# Patient Record
Sex: Male | Born: 1970 | Race: White | Hispanic: No | Marital: Single | Smoking: Current every day smoker
Health system: Southern US, Community
[De-identification: ages and names within clinical notes are randomized; demographics above are authoritative.]

## PROBLEM LIST (undated history)

## (undated) DIAGNOSIS — M1990 Unspecified osteoarthritis, unspecified site: Secondary | ICD-10-CM

## (undated) DIAGNOSIS — B20 Human immunodeficiency virus [HIV] disease: Secondary | ICD-10-CM

## (undated) HISTORY — DX: Human immunodeficiency virus (HIV) disease: B20

## (undated) HISTORY — DX: Unspecified osteoarthritis, unspecified site: M19.90

---

## 2013-03-25 ENCOUNTER — Emergency Department: Payer: Self-pay | Admitting: Emergency Medicine

## 2013-03-25 LAB — CBC
HGB: 14.1 g/dL (ref 13.0–18.0)
Platelet: 82 10*3/uL — ABNORMAL LOW (ref 150–440)
RBC: 3.91 10*6/uL — ABNORMAL LOW (ref 4.40–5.90)
WBC: 7.3 10*3/uL (ref 3.8–10.6)

## 2013-03-25 LAB — COMPREHENSIVE METABOLIC PANEL
Albumin: 4.3 g/dL (ref 3.4–5.0)
BUN: 11 mg/dL (ref 7–18)
Bilirubin,Total: 1.4 mg/dL — ABNORMAL HIGH (ref 0.2–1.0)
Calcium, Total: 9.2 mg/dL (ref 8.5–10.1)
Chloride: 100 mmol/L (ref 98–107)
Co2: 23 mmol/L (ref 21–32)
EGFR (African American): >60
EGFR (Non-African Amer.): >60
Glucose: 180 mg/dL — ABNORMAL HIGH (ref 65–99)
Osmolality: 276 (ref 275–301)

## 2013-03-25 LAB — DRUG SCREEN, URINE
Amphetamines, Ur Screen: NEGATIVE (ref ?–1000)
Benzodiazepine, Ur Scrn: NEGATIVE (ref ?–200)
Cocaine Metabolite,Ur ~~LOC~~: NEGATIVE (ref ?–300)
MDMA (Ecstasy)Ur Screen: NEGATIVE (ref ?–500)
Methadone, Ur Screen: NEGATIVE (ref ?–300)
Opiate, Ur Screen: NEGATIVE (ref ?–300)
Phencyclidine (PCP) Ur S: NEGATIVE (ref ?–25)
Tricyclic, Ur Screen: NEGATIVE (ref ?–1000)

## 2013-03-25 LAB — ETHANOL
Ethanol %: 0.184 % — ABNORMAL HIGH (ref 0.000–0.080)
Ethanol: 184 mg/dL

## 2013-03-25 LAB — ACETAMINOPHEN LEVEL: Acetaminophen: <2 ug/mL

## 2013-03-25 LAB — TSH: Thyroid Stimulating Horm: 2.46 u[IU]/mL

## 2013-03-25 LAB — SALICYLATE LEVEL: Salicylates, Serum: 3.8 mg/dL — ABNORMAL HIGH

## 2013-03-26 LAB — ETHANOL: Ethanol: 13 mg/dL

## 2013-09-26 ENCOUNTER — Other Ambulatory Visit: Payer: Self-pay

## 2013-09-26 ENCOUNTER — Other Ambulatory Visit: Payer: Self-pay | Admitting: *Deleted

## 2013-09-26 DIAGNOSIS — B20 Human immunodeficiency virus [HIV] disease: Secondary | ICD-10-CM

## 2013-09-27 ENCOUNTER — Encounter: Payer: Self-pay | Admitting: *Deleted

## 2013-09-27 ENCOUNTER — Other Ambulatory Visit (INDEPENDENT_AMBULATORY_CARE_PROVIDER_SITE_OTHER): Payer: Self-pay

## 2013-09-27 ENCOUNTER — Other Ambulatory Visit: Payer: Self-pay

## 2013-09-27 DIAGNOSIS — Z113 Encounter for screening for infections with a predominantly sexual mode of transmission: Secondary | ICD-10-CM

## 2013-09-27 DIAGNOSIS — B20 Human immunodeficiency virus [HIV] disease: Secondary | ICD-10-CM

## 2013-09-27 DIAGNOSIS — B1920 Unspecified viral hepatitis C without hepatic coma: Secondary | ICD-10-CM

## 2013-09-27 DIAGNOSIS — Z79899 Other long term (current) drug therapy: Secondary | ICD-10-CM

## 2013-09-27 LAB — CBC WITH DIFFERENTIAL/PLATELET
Basophils Absolute: 0 10*3/uL (ref 0.0–0.1)
Basophils Relative: 0 % (ref 0–1)
Eosinophils Absolute: 0.2 10*3/uL (ref 0.0–0.7)
Eosinophils Relative: 3 % (ref 0–5)
HEMATOCRIT: 38 % — AB (ref 39.0–52.0)
Hemoglobin: 13.4 g/dL (ref 13.0–17.0)
LYMPHS PCT: 35 % (ref 12–46)
Lymphs Abs: 2.6 10*3/uL (ref 0.7–4.0)
MCH: 30.6 pg (ref 26.0–34.0)
MCHC: 35.3 g/dL (ref 30.0–36.0)
MCV: 86.8 fL (ref 78.0–100.0)
MONO ABS: 0.7 10*3/uL (ref 0.1–1.0)
Monocytes Relative: 10 % (ref 3–12)
NEUTROS ABS: 3.8 10*3/uL (ref 1.7–7.7)
Neutrophils Relative %: 52 % (ref 43–77)
Platelets: 218 10*3/uL (ref 150–400)
RBC: 4.38 MIL/uL (ref 4.22–5.81)
RDW: 14.1 % (ref 11.5–15.5)
WBC: 7.3 10*3/uL (ref 4.0–10.5)

## 2013-09-28 LAB — COMPLETE METABOLIC PANEL WITH GFR
ALBUMIN: 4.4 g/dL (ref 3.5–5.2)
ALT: 14 U/L (ref 0–53)
AST: 19 U/L (ref 0–37)
Alkaline Phosphatase: 121 U/L — ABNORMAL HIGH (ref 39–117)
BUN: 13 mg/dL (ref 6–23)
CALCIUM: 9.3 mg/dL (ref 8.4–10.5)
CHLORIDE: 102 meq/L (ref 96–112)
CO2: 28 mEq/L (ref 19–32)
Creat: 0.98 mg/dL (ref 0.50–1.35)
GFR, Est African American: >89 mL/min
GFR, Est Non African American: >89 mL/min
Glucose, Bld: 86 mg/dL (ref 70–99)
Potassium: 3.8 mEq/L (ref 3.5–5.3)
Sodium: 138 mEq/L (ref 135–145)
TOTAL PROTEIN: 6.8 g/dL (ref 6.0–8.3)
Total Bilirubin: 0.3 mg/dL (ref 0.2–1.2)

## 2013-09-28 LAB — LIPID PANEL
Cholesterol: 180 mg/dL (ref 0–200)
HDL: 41 mg/dL (ref >39)
LDL Cholesterol: 101 mg/dL — ABNORMAL HIGH (ref 0–99)
TRIGLYCERIDES: 192 mg/dL — AB (ref <150)
Total CHOL/HDL Ratio: 4.4 Ratio
VLDL: 38 mg/dL (ref 0–40)

## 2013-09-28 LAB — HEPATITIS B SURFACE ANTIBODY,QUALITATIVE: Hep B S Ab: POSITIVE — AB

## 2013-09-28 LAB — HEPATITIS A ANTIBODY, TOTAL: HEP A TOTAL AB: REACTIVE — AB

## 2013-09-28 LAB — HEPATITIS C ANTIBODY: HCV AB: REACTIVE — AB

## 2013-09-28 LAB — HEPATITIS B CORE ANTIBODY, TOTAL: HEP B C TOTAL AB: NONREACTIVE

## 2013-09-28 LAB — RPR

## 2013-09-28 LAB — T-HELPER CELL (CD4) - (RCID CLINIC ONLY)
CD4 % Helper T Cell: 42 % (ref 33–55)
CD4 T CELL ABS: 1140 /uL (ref 400–2700)

## 2013-09-28 LAB — HEPATITIS B SURFACE ANTIGEN: Hepatitis B Surface Ag: NEGATIVE

## 2013-09-28 LAB — HEPATITIS A ANTIBODY, IGM: HEP A IGM: NONREACTIVE

## 2013-09-29 LAB — HIV-1 RNA ULTRAQUANT REFLEX TO GENTYP+
HIV 1 RNA Quant: <20 copies/mL (ref <20)
HIV-1 RNA Quant, Log: <1.3 {Log} (ref <1.30)

## 2013-10-05 LAB — HLA B*5701: HLA-B 5701 W/RFLX HLA-B HIGH: NEGATIVE

## 2013-10-11 ENCOUNTER — Ambulatory Visit: Payer: Self-pay | Admitting: Infectious Diseases

## 2013-10-11 ENCOUNTER — Encounter: Payer: Self-pay | Admitting: Infectious Diseases

## 2013-10-11 ENCOUNTER — Ambulatory Visit (INDEPENDENT_AMBULATORY_CARE_PROVIDER_SITE_OTHER): Payer: Self-pay | Admitting: Infectious Diseases

## 2013-10-11 VITALS — BP 127/83 | HR 60 | Temp 98.7°F | Ht 70.5 in | Wt 154.0 lb

## 2013-10-11 DIAGNOSIS — B1920 Unspecified viral hepatitis C without hepatic coma: Secondary | ICD-10-CM | POA: Insufficient documentation

## 2013-10-11 DIAGNOSIS — M1990 Unspecified osteoarthritis, unspecified site: Secondary | ICD-10-CM

## 2013-10-11 DIAGNOSIS — B20 Human immunodeficiency virus [HIV] disease: Secondary | ICD-10-CM

## 2013-10-11 DIAGNOSIS — Z113 Encounter for screening for infections with a predominantly sexual mode of transmission: Secondary | ICD-10-CM

## 2013-10-11 DIAGNOSIS — M129 Arthropathy, unspecified: Secondary | ICD-10-CM

## 2013-10-11 MED ORDER — EMTRICITABINE-TENOFOVIR DF 200-300 MG PO TABS: 1.0000 | ORAL_TABLET | Freq: Every day | ORAL | Status: DC

## 2013-10-11 MED ORDER — NEVIRAPINE 200 MG PO TABS: 400.0000 mg | ORAL_TABLET | Freq: Every day | ORAL | Status: DC

## 2013-10-11 NOTE — Progress Notes (Signed)
   Subjective:    Patient ID: Matthew Tyler, male    DOB: 06/14/1971, 43 y.o.   MRN: [id number]  HPI 43 yo M with hx of HIV+ since 2005. Has been on NVP/TRV and has had no problems with ART. Has been undetectable as long as he can remember.  Has been off meds for 3 weeks. [place] [facility]  Has been staying at a "regneration" program, off drugs, ETOH.   HIV 1 RNA Quant (copies/mL)  Date Value  09/27/2013 <20      CD4 T Cell Abs (/uL)  Date Value  09/27/2013 1140     Review of Systems  Constitutional: Negative for fever, chills, appetite change and unexpected weight change.  Gastrointestinal: Negative for diarrhea and constipation.  Genitourinary: Positive for dysuria.  Hematological: Negative for adenopathy.       Objective:   Physical Exam  Constitutional: He appears well-developed and well-nourished.  HENT:  Mouth/Throat: No oropharyngeal exudate.  Eyes: EOM are normal. Pupils are equal, round, and reactive to light.  Neck: Neck supple.  Cardiovascular: Normal rate, regular rhythm and normal heart sounds.   Pulmonary/Chest: Effort normal and breath sounds normal.  Abdominal: Soft. Bowel sounds are normal. There is no tenderness.  Musculoskeletal:       Arms: Lymphadenopathy:    He has no cervical adenopathy.    He has no axillary adenopathy.  Skin: No rash noted.          Assessment & Plan:

## 2013-10-11 NOTE — Assessment & Plan Note (Signed)
Will check his Hep C rna, genotype. Check with [facility] to see if can get on study.

## 2013-10-11 NOTE — Addendum Note (Signed)
Addended by: Rodriquez Thorner C on: 10/11/2013 11:23 AM   Modules accepted: Orders

## 2013-10-11 NOTE — Assessment & Plan Note (Signed)
Consider OTCs, may not be able to get at his rehab facility.

## 2013-10-11 NOTE — Assessment & Plan Note (Addendum)
His vax are up to date. He needs to get back on meds. Will get him in with tisha. Offered/refused condoms. Will check his testosterone. He complains fo dysuria today, will check his gc/chalmydia. Will see him back in 1-2 months.  We discussed changing him to a FDC, he does not want to change a this point.  Brother [phone], [email or web address] (casey)

## 2013-10-12 LAB — TESTOSTERONE: Testosterone: 761 ng/dL (ref 300–890)

## 2013-10-12 LAB — URINALYSIS, ROUTINE W REFLEX MICROSCOPIC
BILIRUBIN URINE: NEGATIVE
GLUCOSE, UA: NEGATIVE mg/dL
Hgb urine dipstick: NEGATIVE
KETONES UR: NEGATIVE mg/dL
Leukocytes, UA: NEGATIVE
Nitrite: NEGATIVE
Protein, ur: NEGATIVE mg/dL
Specific Gravity, Urine: 1.007 (ref 1.005–1.030)
UROBILINOGEN UA: 0.2 mg/dL (ref 0.0–1.0)
pH: 6 (ref 5.0–8.0)

## 2013-10-12 LAB — URINE CYTOLOGY ANCILLARY ONLY
CHLAMYDIA, DNA PROBE: NEGATIVE
Neisseria Gonorrhea: NEGATIVE

## 2013-10-13 LAB — URINE CULTURE
COLONY COUNT: NO GROWTH
Organism ID, Bacteria: NO GROWTH

## 2013-10-15 LAB — HEPATITIS C RNA QUANTITATIVE: HCV Quantitative: NOT DETECTED IU/mL (ref <15)

## 2013-10-16 LAB — HEPATITIS C GENOTYPE

## 2013-10-26 ENCOUNTER — Encounter: Payer: Self-pay | Admitting: Infectious Diseases

## 2013-11-07 ENCOUNTER — Other Ambulatory Visit: Payer: Self-pay | Admitting: *Deleted

## 2013-11-07 ENCOUNTER — Telehealth: Payer: Self-pay | Admitting: *Deleted

## 2013-11-07 DIAGNOSIS — B20 Human immunodeficiency virus [HIV] disease: Secondary | ICD-10-CM

## 2013-11-07 MED ORDER — NEVIRAPINE ER 400 MG PO TB24: 400.0000 mg | ORAL_TABLET | Freq: Every day | ORAL | Status: DC

## 2013-11-07 MED ORDER — EMTRICITABINE-TENOFOVIR DF 200-300 MG PO TABS: 1.0000 | ORAL_TABLET | Freq: Every day | ORAL | Status: DC

## 2013-11-07 NOTE — Telephone Encounter (Signed)
Pt ADAP approved 5/20.  Pt has been off meds ~2 months at this time; CD4 on 4/9 was 1140. Please advise if he should restart the viramune/truvada.  If restarting the viramune, please advise if it should be viramune 200mg  2 tablets (400mg  total) daily or viramune XR 400mg  1 tablet daily.  RN unsure of how to proceed, as there were warnings about this medication when I was sending in the prescription to [facility].  CC'ing clinic MD. Andree CossMichelle M Howell, RN

## 2013-11-07 NOTE — Telephone Encounter (Signed)
I have looked into this issue myself but will also cc pharmacy  First of all if he stopped both the viramune and the truvada at the same time there is risk that with exposure to viramune by itself (it has longer half life) he might now have viramune and NNRTI resistance  The 2nd issue is that of restarting the viramune relating to risk for severe and potentially lifethreatening hepatotoxicity if he resumes the full dose immediately without lead in.  i am also concerned with him starting with high cd4 count. In naive pts this medicine is contraindicated with high cd4 count.  I think he needs to come in for HIV RNA with ultra quant for genotype, along with cd4 count. We also need his records. I would not feel comfortable restarting the viramune otherwise and without further thoughts from pharmacy to guide us here  Given this man's proven tendency to start and stop meds perhaps an NNRTI based regimen is not a good idea for him since he may be prone to resistance with these lower barrier to R drugs

## 2013-11-21 ENCOUNTER — Telehealth: Payer: Self-pay | Admitting: *Deleted

## 2013-11-21 NOTE — Telephone Encounter (Signed)
Patient called concerned with lab bills he received for 10/11/13. Patient's Matthew Tyler is active from 09/27/13 through 03/20/14. Advised patient to bring those bills with him at his next office visit on 12/10/13 and will give to our office manager.

## 2013-11-22 ENCOUNTER — Ambulatory Visit: Payer: Self-pay | Admitting: Infectious Diseases

## 2013-12-10 ENCOUNTER — Ambulatory Visit (INDEPENDENT_AMBULATORY_CARE_PROVIDER_SITE_OTHER): Payer: Self-pay | Admitting: Infectious Diseases

## 2013-12-10 ENCOUNTER — Encounter: Payer: Self-pay | Admitting: Infectious Diseases

## 2013-12-10 VITALS — BP 126/81 | HR 63 | Temp 98.6°F | Ht 70.0 in | Wt 150.2 lb

## 2013-12-10 DIAGNOSIS — J309 Allergic rhinitis, unspecified: Secondary | ICD-10-CM

## 2013-12-10 DIAGNOSIS — B20 Human immunodeficiency virus [HIV] disease: Secondary | ICD-10-CM

## 2013-12-10 NOTE — Assessment & Plan Note (Signed)
He is doing well. Will check his CD4 and HIV RNA today. Never got records from [place].  He is offered/refuses condoms.

## 2013-12-10 NOTE — Assessment & Plan Note (Signed)
RNA (-). Cured/immune.

## 2013-12-10 NOTE — Assessment & Plan Note (Signed)
Advised him try OTC allerga/claritin/zyrtec. D for 1 week, then routine. Also take flonase for 1 week. If he still has neck pain, will send him for u/s.

## 2013-12-10 NOTE — Progress Notes (Signed)
   Subjective:    Patient ID: Matthew Tyler, male    DOB: 08/20/1970, 43 y.o.   MRN: [id number]  HPI 43 yo M with hx of HIV+ since 2005. Has been on NVP/TRV and has had no problems with ART. Has been undetectable as long as he can remember.  Has been busy helping with his parents home.  Has been having R sinus pain, ear pain, neck pain. Has tried gargling with lemon water. Has nasal d/c (occas yellow). Has taken claritin-D previously.   HIV 1 RNA Quant (copies/mL)  Date Value  09/27/2013 <20      CD4 T Cell Abs (/uL)  Date Value  09/27/2013 1140     Review of Systems  Constitutional: Negative for fever and chills.  HENT: Positive for rhinorrhea and sinus pressure. Negative for voice change.   Gastrointestinal: Positive for constipation. Negative for diarrhea.  Genitourinary: Negative for difficulty urinating.  Neurological: Positive for headaches.       Objective:   Physical Exam  Constitutional: He appears well-developed and well-nourished.  HENT:  Right Ear: Tympanic membrane is retracted.  Left Ear: Tympanic membrane is retracted.  Mouth/Throat: No oropharyngeal exudate.  Eyes: EOM are normal. Pupils are equal, round, and reactive to light.  Neck: Neck supple. No mass and no thyromegaly present.    Cardiovascular: Normal rate, regular rhythm and normal heart sounds.   Pulmonary/Chest: Effort normal and breath sounds normal.  Abdominal: Soft. Bowel sounds are normal. He exhibits no distension. There is no tenderness.  Musculoskeletal: He exhibits no edema.  Lymphadenopathy:    He has no cervical adenopathy.          Assessment & Plan:

## 2013-12-11 LAB — T-HELPER CELL (CD4) - (RCID CLINIC ONLY)
CD4 % Helper T Cell: 36 % (ref 33–55)
CD4 T Cell Abs: 870 /uL (ref 400–2700)

## 2013-12-12 LAB — HIV-1 RNA QUANT-NO REFLEX-BLD
HIV 1 RNA Quant: 4397 copies/mL — ABNORMAL HIGH (ref <20)
HIV-1 RNA Quant, Log: 3.64 {Log} — ABNORMAL HIGH (ref <1.30)

## 2013-12-31 ENCOUNTER — Other Ambulatory Visit: Payer: Self-pay | Admitting: Infectious Diseases

## 2013-12-31 ENCOUNTER — Telehealth: Payer: Self-pay | Admitting: *Deleted

## 2013-12-31 DIAGNOSIS — B20 Human immunodeficiency virus [HIV] disease: Secondary | ICD-10-CM

## 2013-12-31 NOTE — Telephone Encounter (Signed)
Patient called back about a sooner appt due to detectable virus. When he had his labs done on 12/10/13 he had just been back on meds for 2 weeks after being off for 6 weeks. Scheduled a lab appt for 01/02/14 to recheck viral load and CD4. Matthew MolaJacqueline Cockerham

## 2013-12-31 NOTE — Telephone Encounter (Signed)
Message copied by Macy MisOCKERHAM, Madisun Hargrove A on Mon Dec 31, 2013  3:28 PM ------      Message from: Pieter PartridgeHOPKINS, JAMIE L      Created: Mon Dec 31, 2013  2:36 PM       Can someone help schedule this patient in the next 4 weeks with Dr. Lynett FishHatcher-detectable      Thanks      Asher MuirJamie       ----- Message -----         From: Ginnie SmartJeffrey C Hatcher, MD         Sent: 12/31/2013  12:15 PM           To: Haig ProphetJamie L Hopkins            Needs appt next 4 weeks      thanks      ----- Message -----         From: [facility]         Sent: 12/11/2013   1:29 PM           To: Ginnie SmartJeffrey C Hatcher, MD                         ------

## 2013-12-31 NOTE — Telephone Encounter (Signed)
Called patient's emergency contact and updated his phone number. Left him a voicemail to call the clinic to schedule an appointment at the end of August with Dr. Ninetta LightsHatcher.

## 2014-01-02 ENCOUNTER — Other Ambulatory Visit (INDEPENDENT_AMBULATORY_CARE_PROVIDER_SITE_OTHER): Payer: Self-pay

## 2014-01-02 ENCOUNTER — Telehealth: Payer: Self-pay | Admitting: *Deleted

## 2014-01-02 DIAGNOSIS — B20 Human immunodeficiency virus [HIV] disease: Secondary | ICD-10-CM

## 2014-01-02 NOTE — Telephone Encounter (Signed)
Patient came in for labs and had concerns about his viral load being detectable. He would like a call when his results are in. He also wanted to know if we had received [place] records from [place]. Looks like we only received one office note. Release resent to [facility]ash [facility] fax [phone] and Dr. Rosina LowensteinJulie Horton phone [phone] and fax [phone]. Wendall MolaJacqueline Cythia Bachtel

## 2014-01-03 LAB — T-HELPER CELL (CD4) - (RCID CLINIC ONLY)
CD4 % Helper T Cell: 35 % (ref 33–55)
CD4 T Cell Abs: 1060 /uL (ref 400–2700)

## 2014-01-03 LAB — HIV-1 RNA ULTRAQUANT REFLEX TO GENTYP+
HIV 1 RNA Quant: 2912 copies/mL — ABNORMAL HIGH (ref <20)
HIV-1 RNA Quant, Log: 3.46 {Log} — ABNORMAL HIGH (ref <1.30)

## 2014-01-03 NOTE — Telephone Encounter (Signed)
Thanks Jackie 

## 2014-01-04 ENCOUNTER — Telehealth: Payer: Self-pay | Admitting: *Deleted

## 2014-01-04 NOTE — Telephone Encounter (Signed)
Message copied by Andree CossHOWELL, Hadiya Spoerl M on Fri Jan 04, 2014  4:05 PM ------      Message from: HATCHER, JEFFREY C      Created: Fri Jan 04, 2014 11:33 AM       Pt needs appt            ----- Message -----         From: [facility] [place] [facility]         Sent: 01/03/2014   6:15 PM           To: Ginnie SmartJeffrey C Hatcher, MD                   ------

## 2014-01-04 NOTE — Telephone Encounter (Signed)
LM for patient to call for an appointment.  Dr. Ninetta LightsHatcher currently has an appointment available both Monday and Wednesday  Next week. Andree CossHowell, Michelle M, RN

## 2014-01-04 NOTE — Telephone Encounter (Signed)
Pt coming Monday 11:00, 01/07/14.

## 2014-01-07 ENCOUNTER — Ambulatory Visit (INDEPENDENT_AMBULATORY_CARE_PROVIDER_SITE_OTHER): Payer: Self-pay | Admitting: Infectious Diseases

## 2014-01-07 ENCOUNTER — Encounter: Payer: Self-pay | Admitting: Infectious Diseases

## 2014-01-07 VITALS — Temp 98.4°F | Ht 70.0 in | Wt 148.0 lb

## 2014-01-07 DIAGNOSIS — B20 Human immunodeficiency virus [HIV] disease: Secondary | ICD-10-CM

## 2014-01-07 NOTE — Progress Notes (Signed)
   Subjective:    Patient ID: Matthew Tyler, male    DOB: 09/25/1970, 43 y.o.   MRN: [id number]  HPI 43 yo M with hx of HIV+, sinus disease. Here to discuss results of his labs ( has detectable VL). His genotype is pending.  His sinus sx have improved with taking OTC rx (can't remember name).   HIV 1 RNA Quant (copies/mL)  Date Value  01/02/2014 2912*  12/10/2013 4397*  09/27/2013 <20      CD4 T Cell Abs (/uL)  Date Value  01/02/2014 1060   12/10/2013 870   09/27/2013 1140        Review of Systems     Objective:   Physical Exam  Constitutional: He appears well-developed and well-nourished.          Assessment & Plan:

## 2014-01-07 NOTE — Assessment & Plan Note (Addendum)
Will call him back next week when we have results of his labs. This is a "no charge" visit. He does not wish to change his rx however understands that if he has resistance mutations he may have to...Marland Kitchen.Marland Kitchen.  Preferred phone- [phone]

## 2014-01-15 ENCOUNTER — Telehealth: Payer: Self-pay | Admitting: *Deleted

## 2014-01-15 LAB — HIV-1 GENOTYPR PLUS

## 2014-01-15 NOTE — Telephone Encounter (Signed)
error 

## 2014-01-21 ENCOUNTER — Telehealth: Payer: Self-pay | Admitting: *Deleted

## 2014-01-21 NOTE — Telephone Encounter (Signed)
Genotype has been completed.  Does the patient need to change regimens?  Please advise.  Andree CossHowell, Hibah Odonnell M, RN

## 2014-01-21 NOTE — Telephone Encounter (Signed)
No resistance mutations and he can continue with same.  It looks like he has not been on meds.   He needs to see Dr. Ninetta LightsHatcher asap later this month.

## 2014-01-22 NOTE — Telephone Encounter (Signed)
Patient states he has been taking his medication, declined an appointment - states that Dr. Ninetta LightsHatcher was going to call him with lab results rather than bringing him back in for another office visit.  Pt aware that he will be back in clinic the week on 8/17. Will route to Dr. Ninetta LightsHatcher for advice.

## 2014-01-30 NOTE — Telephone Encounter (Signed)
Patient aware that his genotype showed no resistance and his next appointment with Dr. Ninetta LightsHatcher is 06/12/14 with labs same day. Told him he would be notified if he needed to be seen sooner. Matthew MolaJacqueline Akanksha Bellmore

## 2014-02-05 NOTE — Telephone Encounter (Signed)
Pt needs sooner appt thanks

## 2014-02-06 NOTE — Telephone Encounter (Signed)
Appointment moved up to 03/06/14. Patient aware

## 2014-02-19 ENCOUNTER — Ambulatory Visit: Payer: Self-pay

## 2014-02-19 ENCOUNTER — Other Ambulatory Visit: Payer: Self-pay | Admitting: *Deleted

## 2014-02-19 DIAGNOSIS — B20 Human immunodeficiency virus [HIV] disease: Secondary | ICD-10-CM

## 2014-02-19 MED ORDER — EMTRICITABINE-TENOFOVIR DF 200-300 MG PO TABS: 1.0000 | ORAL_TABLET | Freq: Every day | ORAL | Status: DC

## 2014-02-19 MED ORDER — NEVIRAPINE ER 400 MG PO TB24: 400.0000 mg | ORAL_TABLET | Freq: Every day | ORAL | Status: DC

## 2014-02-19 NOTE — Progress Notes (Signed)
ADAP 

## 2014-03-06 ENCOUNTER — Encounter: Payer: Self-pay | Admitting: Infectious Diseases

## 2014-03-06 ENCOUNTER — Ambulatory Visit (INDEPENDENT_AMBULATORY_CARE_PROVIDER_SITE_OTHER): Payer: Self-pay | Admitting: Infectious Diseases

## 2014-03-06 VITALS — BP 134/89 | HR 78 | Temp 99.0°F | Ht 70.0 in | Wt 151.0 lb

## 2014-03-06 DIAGNOSIS — J309 Allergic rhinitis, unspecified: Secondary | ICD-10-CM

## 2014-03-06 DIAGNOSIS — B20 Human immunodeficiency virus [HIV] disease: Secondary | ICD-10-CM

## 2014-03-06 DIAGNOSIS — Z23 Encounter for immunization: Secondary | ICD-10-CM

## 2014-03-06 NOTE — Assessment & Plan Note (Signed)
AB+, RNA (-).

## 2014-03-06 NOTE — Assessment & Plan Note (Signed)
Asked him to consider OTC, nasal saline.  He does not want to take anything.

## 2014-03-06 NOTE — Progress Notes (Signed)
   Subjective:    Patient ID: Matthew Tyler, male    DOB: 31-May-1971, 43 y.o.   MRN: [id number]  HPI 43 yo M with hx of HIV+, sinus disease. He was previously undetectable, on TRV/NVP. 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  Has been feeling well, has been gaining wt.  Has gotten new job.  Has occas pain in his neck, radiates to his ear (R). Has had sinus issues as well.   HIV 1 RNA Quant (copies/mL)  Date Value  01/02/2014 2912*  12/10/2013 4397*  09/27/2013 <20      CD4 T Cell Abs (/uL)  Date Value  01/02/2014 1060   12/10/2013 870   09/27/2013 1140     Review of Systems  Constitutional: Negative for appetite change and unexpected weight change.  Gastrointestinal: Negative for diarrhea and constipation.  Genitourinary: Negative for difficulty urinating.       Objective:   Physical Exam  Constitutional: He appears well-developed and well-nourished.  HENT:  Mouth/Throat: No oropharyngeal exudate.  Eyes: EOM are normal. Pupils are equal, round, and reactive to light.  Neck: Neck supple.  Cardiovascular: Normal rate, regular rhythm and normal heart sounds.   Pulmonary/Chest: Effort normal and breath sounds normal.  Abdominal: Soft. Bowel sounds are normal. He exhibits no distension. There is no tenderness.  Lymphadenopathy:    He has no cervical adenopathy.          Assessment & Plan:

## 2014-03-06 NOTE — Assessment & Plan Note (Signed)
Will recheck his VL , genotype and CD4 today. He appears to be doing well despite his VL. Flu shot today. Offered/refused condoms. rtc 4 months.

## 2014-03-07 DIAGNOSIS — Z23 Encounter for immunization: Secondary | ICD-10-CM

## 2014-03-07 LAB — HIV-1 RNA ULTRAQUANT REFLEX TO GENTYP+
HIV 1 RNA Quant: 191 copies/mL — ABNORMAL HIGH (ref <20)
HIV-1 RNA Quant, Log: 2.28 {Log} — ABNORMAL HIGH (ref <1.30)

## 2014-03-07 LAB — T-HELPER CELL (CD4) - (RCID CLINIC ONLY)
CD4 % Helper T Cell: 35 % (ref 33–55)
CD4 T Cell Abs: 980 /uL (ref 400–2700)

## 2014-03-11 ENCOUNTER — Telehealth: Payer: Self-pay | Admitting: *Deleted

## 2014-03-11 NOTE — Telephone Encounter (Signed)
Shared CD4 and VL values.  Praised pt for taking medications and to keep up the good work.  Pt next with with Dr. Ninetta Lights 06/2014.

## 2014-04-08 ENCOUNTER — Telehealth: Payer: Self-pay | Admitting: Infectious Diseases

## 2014-04-09 NOTE — Telephone Encounter (Signed)
error 

## 2014-04-27 ENCOUNTER — Other Ambulatory Visit: Payer: Self-pay | Admitting: Infectious Diseases

## 2014-06-12 ENCOUNTER — Ambulatory Visit: Payer: Self-pay | Admitting: Infectious Diseases

## 2014-07-08 ENCOUNTER — Ambulatory Visit: Payer: Self-pay | Admitting: Infectious Diseases

## 2014-07-09 ENCOUNTER — Ambulatory Visit (HOSPITAL_COMMUNITY)
Admission: RE | Admit: 2014-07-09 | Discharge: 2014-07-09 | Disposition: A | Discharge status: Home / Self Care | Payer: Self-pay | Source: Ambulatory Visit | Attending: Infectious Diseases | Admitting: Infectious Diseases

## 2014-07-09 ENCOUNTER — Encounter: Payer: Self-pay | Admitting: Infectious Diseases

## 2014-07-09 ENCOUNTER — Ambulatory Visit (INDEPENDENT_AMBULATORY_CARE_PROVIDER_SITE_OTHER): Payer: Self-pay | Admitting: Infectious Diseases

## 2014-07-09 VITALS — BP 135/87 | HR 101 | Temp 98.5°F | Ht 70.0 in | Wt 145.0 lb

## 2014-07-09 DIAGNOSIS — B20 Human immunodeficiency virus [HIV] disease: Secondary | ICD-10-CM

## 2014-07-09 DIAGNOSIS — M25532 Pain in left wrist: Secondary | ICD-10-CM | POA: Insufficient documentation

## 2014-07-09 DIAGNOSIS — Z79899 Other long term (current) drug therapy: Secondary | ICD-10-CM

## 2014-07-09 DIAGNOSIS — W19XXXA Unspecified fall, initial encounter: Secondary | ICD-10-CM | POA: Insufficient documentation

## 2014-07-09 DIAGNOSIS — Z113 Encounter for screening for infections with a predominantly sexual mode of transmission: Secondary | ICD-10-CM

## 2014-07-09 LAB — CBC
HCT: 42.8 % (ref 39.0–52.0)
HEMOGLOBIN: 14.8 g/dL (ref 13.0–17.0)
MCH: 33.1 pg (ref 26.0–34.0)
MCHC: 34.6 g/dL (ref 30.0–36.0)
MCV: 95.7 fL (ref 78.0–100.0)
MPV: 10 fL (ref 8.6–12.4)
Platelets: 259 10*3/uL (ref 150–400)
RBC: 4.47 MIL/uL (ref 4.22–5.81)
RDW: 13.9 % (ref 11.5–15.5)
WBC: 10.6 10*3/uL — AB (ref 4.0–10.5)

## 2014-07-09 LAB — COMPREHENSIVE METABOLIC PANEL
ALK PHOS: 67 U/L (ref 39–117)
ALT: 12 U/L (ref 0–53)
AST: 24 U/L (ref 0–37)
Albumin: 4.4 g/dL (ref 3.5–5.2)
BILIRUBIN TOTAL: 0.4 mg/dL (ref 0.2–1.2)
BUN: 16 mg/dL (ref 6–23)
CO2: 28 meq/L (ref 19–32)
Calcium: 9.3 mg/dL (ref 8.4–10.5)
Chloride: 103 mEq/L (ref 96–112)
Creat: 1.09 mg/dL (ref 0.50–1.35)
Glucose, Bld: 82 mg/dL (ref 70–99)
POTASSIUM: 4 meq/L (ref 3.5–5.3)
Sodium: 139 mEq/L (ref 135–145)
Total Protein: 7.8 g/dL (ref 6.0–8.3)

## 2014-07-09 LAB — LIPID PANEL
CHOLESTEROL: 213 mg/dL — AB (ref 0–200)
HDL: 57 mg/dL (ref >39)
LDL CALC: 124 mg/dL — AB (ref 0–99)
Total CHOL/HDL Ratio: 3.7 Ratio
Triglycerides: 158 mg/dL — ABNORMAL HIGH (ref <150)
VLDL: 32 mg/dL (ref 0–40)

## 2014-07-09 MED ORDER — ELVITEG-COBIC-EMTRICIT-TENOFDF 150-150-200-300 MG PO TABS: 1.0000 | ORAL_TABLET | Freq: Every day | ORAL | Status: DC

## 2014-07-09 NOTE — Progress Notes (Signed)
Patient ID: Matthew Tyler, male   DOB: [birth year], 44 y.o.   MRN: [id number] HPI: Matthew Tyler is a 44 y.o. male with HIV who is here for his follow up visit.   Allergies: Allergies  Allergen Reactions  . Penicillins     Vitals: Temp: 98.5 F (36.9 C) (01/19 1348) Temp Source: Oral (01/19 1348) BP: 135/87 mmHg (01/19 1348) Pulse Rate: 101 (01/19 1348)  Past Medical History: Past Medical History  Diagnosis Date  . HIV disease   . Arthritis     Social History: History   Social History  . Marital Status: Single    Spouse Name: N/A    Number of Children: N/A  . Years of Education: N/A   Social History Main Topics  . Smoking status: Current Every Day Smoker -- 0.50 packs/day for 32 years    Types: Cigarettes    Last Attempt to Quit: 09/19/2012  . Smokeless tobacco: Never Used  . Alcohol Use: 0.0 oz/week    0 Not specified per week     Comment: social  . Drug Use: No  . Sexual Activity:    Partners: Male     Comment: declined condoms   Other Topics Concern  . None   Social History Narrative    Previous Regimen:   Current Regimen: NVP + TRV  Labs: HIV 1 RNA QUANT (copies/mL)  Date Value  03/06/2014 191*  01/02/2014 2912*  12/10/2013 4397*   CD4 T CELL ABS (/uL)  Date Value  03/06/2014 980  01/02/2014 1060  12/10/2013 870   HEP B S AB (no units)  Date Value  09/27/2013 POS*   HEPATITIS B SURFACE AG (no units)  Date Value  09/27/2013 NEGATIVE   HCV AB (no units)  Date Value  09/27/2013 REACTIVE*    CrCl: CrCl cannot be calculated (Patient has no serum creatinine result on file.).  Lipids:    Component Value Date/Time   CHOL 180 09/27/2013 1547   TRIG 192* 09/27/2013 1547   HDL 41 09/27/2013 1547   CHOLHDL 4.4 09/27/2013 1547   VLDL 38 09/27/2013 1547   LDLCALC 101* 09/27/2013 1547    Assessment: 44 yo who has been on TRV/NVP for a while until was off of it for a few months. That's when is VL jumped up a little bit. He  restarted on it but still have some low VL in 9/15. We are going to change him over to Triumeq today. He is HLA neg so that would be fine. This will make it easier overall. Explained the potential side effects to him.   Recommendations: Stop NVP/TRU Start Triumeq 1 PO qday  Wilfred Lacy, PharmD Clinical Infectious [facility] 07/09/2014, 2:23 PM

## 2014-07-09 NOTE — Assessment & Plan Note (Signed)
Will send him for plain film.

## 2014-07-09 NOTE — Progress Notes (Signed)
   Subjective:    Patient ID: Matthew Tyler, male    DOB: Jul 04, 1970, 44 y.o.   MRN: [id number]  HPI 44 yo M with hx of HIV+, sinus disease. He was previously undetectable, on TRV/NVP. 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  Has been doing well. Has been on TRV/NVP for > 10 years now. Has been having having L wrist pain since falling off a hover board.   HIV 1 RNA QUANT (copies/mL)  Date Value  03/06/2014 191*  01/02/2014 2912*  12/10/2013 4397*   CD4 T CELL ABS (/uL)  Date Value  03/06/2014 980  01/02/2014 1060  12/10/2013 870      Review of Systems  Constitutional: Negative for appetite change and unexpected weight change.  Gastrointestinal: Negative for diarrhea and constipation.  Genitourinary: Negative for difficulty urinating.  Musculoskeletal: Positive for arthralgias.       Objective:   Physical Exam  Constitutional: He appears well-developed and well-nourished.  HENT:  Mouth/Throat: No oropharyngeal exudate.  Eyes: EOM are normal. Pupils are equal, round, and reactive to light.  Neck: Neck supple.  Cardiovascular: Normal rate, regular rhythm and normal heart sounds.   Pulmonary/Chest: Effort normal and breath sounds normal.  Abdominal: Soft. Bowel sounds are normal. He exhibits no distension. There is no tenderness.  Musculoskeletal:       Arms: Lymphadenopathy:    He has no cervical adenopathy.          Assessment & Plan:

## 2014-07-09 NOTE — Assessment & Plan Note (Signed)
Has gotten flu shot. Will give him condoms. Will check his labs today, as well as STD abs for unprotected exposure.  Will change him to stribild, genvoya to ease his dosing.  Will see him back in 3 months.

## 2014-07-10 ENCOUNTER — Other Ambulatory Visit: Payer: Self-pay | Admitting: *Deleted

## 2014-07-10 DIAGNOSIS — B20 Human immunodeficiency virus [HIV] disease: Secondary | ICD-10-CM

## 2014-07-10 LAB — T-HELPER CELL (CD4) - (RCID CLINIC ONLY)
CD4 % Helper T Cell: 40 % (ref 33–55)
CD4 T Cell Abs: 1180 /uL (ref 400–2700)

## 2014-07-10 LAB — RPR

## 2014-07-10 LAB — URINE CYTOLOGY ANCILLARY ONLY
Chlamydia: NEGATIVE
Neisseria Gonorrhea: NEGATIVE

## 2014-07-10 LAB — HIV-1 RNA ULTRAQUANT REFLEX TO GENTYP+
HIV 1 RNA Quant: <20 copies/mL (ref <20)
HIV-1 RNA Quant, Log: <1.3 {Log} (ref <1.30)

## 2014-07-10 MED ORDER — ELVITEG-COBIC-EMTRICIT-TENOFDF 150-150-200-300 MG PO TABS: 1.0000 | ORAL_TABLET | Freq: Every day | ORAL | Status: DC

## 2014-09-24 ENCOUNTER — Other Ambulatory Visit (INDEPENDENT_AMBULATORY_CARE_PROVIDER_SITE_OTHER): Payer: Self-pay

## 2014-09-24 DIAGNOSIS — B20 Human immunodeficiency virus [HIV] disease: Secondary | ICD-10-CM

## 2014-09-24 LAB — COMPLETE METABOLIC PANEL WITH GFR
ALT: 16 U/L (ref 0–53)
AST: 27 U/L (ref 0–37)
Albumin: 4.5 g/dL (ref 3.5–5.2)
Alkaline Phosphatase: 57 U/L (ref 39–117)
BILIRUBIN TOTAL: 0.8 mg/dL (ref 0.2–1.2)
BUN: 11 mg/dL (ref 6–23)
CO2: 26 meq/L (ref 19–32)
Calcium: 8.9 mg/dL (ref 8.4–10.5)
Chloride: 100 mEq/L (ref 96–112)
Creat: 0.8 mg/dL (ref 0.50–1.35)
GFR, Est African American: >89 mL/min
GFR, Est Non African American: >89 mL/min
Glucose, Bld: 108 mg/dL — ABNORMAL HIGH (ref 70–99)
Potassium: 4 mEq/L (ref 3.5–5.3)
Sodium: 137 mEq/L (ref 135–145)
Total Protein: 7 g/dL (ref 6.0–8.3)

## 2014-09-24 LAB — CBC WITH DIFFERENTIAL/PLATELET
Basophils Absolute: 0 10*3/uL (ref 0.0–0.1)
Basophils Relative: 0 % (ref 0–1)
EOS PCT: 1 % (ref 0–5)
Eosinophils Absolute: 0.1 10*3/uL (ref 0.0–0.7)
HEMATOCRIT: 43.9 % (ref 39.0–52.0)
HEMOGLOBIN: 14.9 g/dL (ref 13.0–17.0)
LYMPHS ABS: 2.4 10*3/uL (ref 0.7–4.0)
LYMPHS PCT: 28 % (ref 12–46)
MCH: 32.4 pg (ref 26.0–34.0)
MCHC: 33.9 g/dL (ref 30.0–36.0)
MCV: 95.4 fL (ref 78.0–100.0)
MONO ABS: 0.9 10*3/uL (ref 0.1–1.0)
MONOS PCT: 10 % (ref 3–12)
MPV: 10.3 fL (ref 8.6–12.4)
NEUTROS ABS: 5.3 10*3/uL (ref 1.7–7.7)
Neutrophils Relative %: 61 % (ref 43–77)
Platelets: 232 10*3/uL (ref 150–400)
RBC: 4.6 MIL/uL (ref 4.22–5.81)
RDW: 14.2 % (ref 11.5–15.5)
WBC: 8.7 10*3/uL (ref 4.0–10.5)

## 2014-09-24 NOTE — Addendum Note (Signed)
Addended by: Alesia MorinPOOLE, TRAVIS F on: 09/24/2014 11:54 AM   Modules accepted: Orders

## 2014-09-25 LAB — T-HELPER CELL (CD4) - (RCID CLINIC ONLY)
CD4 % Helper T Cell: 40 % (ref 33–55)
CD4 T CELL ABS: 1000 /uL (ref 400–2700)

## 2014-09-25 LAB — HIV-1 RNA QUANT-NO REFLEX-BLD
HIV 1 RNA Quant: 28 copies/mL — ABNORMAL HIGH (ref <20)
HIV-1 RNA Quant, Log: 1.45 {Log} — ABNORMAL HIGH (ref <1.30)

## 2014-10-08 ENCOUNTER — Encounter: Payer: Self-pay | Admitting: Infectious Diseases

## 2014-10-08 ENCOUNTER — Ambulatory Visit (INDEPENDENT_AMBULATORY_CARE_PROVIDER_SITE_OTHER): Payer: Self-pay | Admitting: Infectious Diseases

## 2014-10-08 VITALS — BP 125/85 | HR 76 | Temp 98.9°F | Ht 70.0 in | Wt 147.0 lb

## 2014-10-08 DIAGNOSIS — F17200 Nicotine dependence, unspecified, uncomplicated: Secondary | ICD-10-CM | POA: Insufficient documentation

## 2014-10-08 DIAGNOSIS — Z720 Tobacco use: Secondary | ICD-10-CM

## 2014-10-08 DIAGNOSIS — B20 Human immunodeficiency virus [HIV] disease: Secondary | ICD-10-CM

## 2014-10-08 DIAGNOSIS — E785 Hyperlipidemia, unspecified: Secondary | ICD-10-CM | POA: Insufficient documentation

## 2014-10-08 NOTE — Assessment & Plan Note (Signed)
He is doing well, still with low level viremia.  Ultra-deep sequencing next lab? Given condoms.  vax up to date, hep A/B immune.  rtc in 6 months

## 2014-10-08 NOTE — Progress Notes (Signed)
   Subjective:    Patient ID: Matthew Tyler, male    DOB: 04/23/1971, 44 y.o.   MRN: [id number]  HPI 44 yo M with hx of HIV+. 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 He was then changed to triumeq 06-2014 (genotype PR: A71T).   HIV 1 RNA QUANT (copies/mL)  Date Value  09/24/2014 28*  07/09/2014 <20  03/06/2014 191*   CD4 T CELL ABS (/uL)  Date Value  09/24/2014 1000  07/09/2014 1180  03/06/2014 980   Lab Results  Component Value Date   CHOL 213* 07/09/2014   HDL 57 07/09/2014   LDLCALC 124* 07/09/2014   TRIG 158* 07/09/2014   CHOLHDL 3.7 07/09/2014    Has been trying to gain wt, build muscles mass. Eating well.  Has been taking essential oils, some topical and some pills.  Still smoking, unchanged.   Review of Systems  Constitutional: Negative for appetite change and unexpected weight change.  Gastrointestinal: Negative for diarrhea and constipation.  Genitourinary: Negative for difficulty urinating.       Objective:   Physical Exam  Constitutional: He appears well-developed and well-nourished.  HENT:  Mouth/Throat: No oropharyngeal exudate.  Eyes: EOM are normal. Pupils are equal, round, and reactive to light.  Neck: Neck supple.  Cardiovascular: Normal rate, regular rhythm and normal heart sounds.   Pulmonary/Chest: Effort normal and breath sounds normal.  Abdominal: Soft. Bowel sounds are normal. He exhibits no distension. There is no tenderness.  Lymphadenopathy:    He has no cervical adenopathy.      Assessment & Plan:

## 2014-10-08 NOTE — Assessment & Plan Note (Signed)
Watch his low grade trig and chol elevations.

## 2014-10-08 NOTE — Assessment & Plan Note (Signed)
Encouraged him to quit, he is trying.  

## 2015-01-20 ENCOUNTER — Telehealth: Payer: Self-pay | Admitting: *Deleted

## 2015-01-20 NOTE — Telephone Encounter (Signed)
Left message notifying patient that ADAP renewal is due this week, asking him to call and schedule an appointment with LyDonia or another financial counselor to avoid disruption in his medication. Patient also needs to schedule follow up with Dr. Ninetta Lights for October. Andree Coss, RN

## 2015-01-27 ENCOUNTER — Ambulatory Visit: Payer: Self-pay

## 2015-04-14 ENCOUNTER — Ambulatory Visit (INDEPENDENT_AMBULATORY_CARE_PROVIDER_SITE_OTHER): Payer: Self-pay | Admitting: Infectious Diseases

## 2015-04-14 ENCOUNTER — Encounter: Payer: Self-pay | Admitting: Infectious Diseases

## 2015-04-14 VITALS — BP 144/97 | HR 76 | Temp 98.3°F | Ht 70.0 in | Wt 144.0 lb

## 2015-04-14 DIAGNOSIS — E785 Hyperlipidemia, unspecified: Secondary | ICD-10-CM

## 2015-04-14 DIAGNOSIS — F17200 Nicotine dependence, unspecified, uncomplicated: Secondary | ICD-10-CM

## 2015-04-14 DIAGNOSIS — R05 Cough: Secondary | ICD-10-CM

## 2015-04-14 DIAGNOSIS — B20 Human immunodeficiency virus [HIV] disease: Secondary | ICD-10-CM

## 2015-04-14 DIAGNOSIS — R059 Cough, unspecified: Secondary | ICD-10-CM | POA: Insufficient documentation

## 2015-04-14 DIAGNOSIS — Z23 Encounter for immunization: Secondary | ICD-10-CM

## 2015-04-14 DIAGNOSIS — Z79899 Other long term (current) drug therapy: Secondary | ICD-10-CM

## 2015-04-14 DIAGNOSIS — Z113 Encounter for screening for infections with a predominantly sexual mode of transmission: Secondary | ICD-10-CM

## 2015-04-14 LAB — CBC
HEMATOCRIT: 42.3 % (ref 39.0–52.0)
Hemoglobin: 14.6 g/dL (ref 13.0–17.0)
MCH: 33.4 pg (ref 26.0–34.0)
MCHC: 34.5 g/dL (ref 30.0–36.0)
MCV: 96.8 fL (ref 78.0–100.0)
MPV: 10.2 fL (ref 8.6–12.4)
Platelets: 204 10*3/uL (ref 150–400)
RBC: 4.37 MIL/uL (ref 4.22–5.81)
RDW: 13.6 % (ref 11.5–15.5)
WBC: 8.7 10*3/uL (ref 4.0–10.5)

## 2015-04-14 LAB — COMPREHENSIVE METABOLIC PANEL
ALBUMIN: 4.6 g/dL (ref 3.6–5.1)
ALK PHOS: 58 U/L (ref 40–115)
ALT: 14 U/L (ref 9–46)
AST: 26 U/L (ref 10–40)
BUN: 13 mg/dL (ref 7–25)
CHLORIDE: 102 mmol/L (ref 98–110)
CO2: 29 mmol/L (ref 20–31)
CREATININE: 0.94 mg/dL (ref 0.60–1.35)
Calcium: 9.6 mg/dL (ref 8.6–10.3)
Glucose, Bld: 91 mg/dL (ref 65–99)
POTASSIUM: 4.4 mmol/L (ref 3.5–5.3)
SODIUM: 140 mmol/L (ref 135–146)
TOTAL PROTEIN: 7.6 g/dL (ref 6.1–8.1)
Total Bilirubin: 0.4 mg/dL (ref 0.2–1.2)

## 2015-04-14 LAB — LIPID PANEL
Cholesterol: 183 mg/dL (ref 125–200)
HDL: 50 mg/dL (ref >=40)
LDL Cholesterol: 111 mg/dL (ref <130)
TRIGLYCERIDES: 109 mg/dL (ref <150)
Total CHOL/HDL Ratio: 3.7 Ratio (ref <=5.0)
VLDL: 22 mg/dL (ref <30)

## 2015-04-14 NOTE — Progress Notes (Signed)
   Subjective:    Patient ID: Matthew Tyler, male    DOB: 04/14/1971, 44 y.o.   MRN: [id number]  HPI 44 yo M with hx of HIV+. 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 He was then changed to triumeq 06-2014 (genotype PR: A71T).   HIV 1 RNA QUANT (copies/mL)  Date Value  09/24/2014 28*  07/09/2014 <20  03/06/2014 191*   CD4 T CELL ABS (/uL)  Date Value  09/24/2014 1000  07/09/2014 1180  03/06/2014 980   Has some coughing at night when asleep. Does not cough in daytime. Needs to quit smoking  Snoring as well. No bad taste or chest pain or reflux.  Has cut smoking in half, no durgs.   Review of Systems  Constitutional: Negative for appetite change and unexpected weight change.  HENT: Negative for postnasal drip and sore throat.   Respiratory: Positive for cough. Negative for shortness of breath.   Cardiovascular: Negative for chest pain.  Gastrointestinal: Negative for diarrhea and constipation.  Genitourinary: Negative for difficulty urinating.  Neurological: Negative for headaches.       Objective:   Physical Exam  Constitutional: He appears well-developed and well-nourished.  HENT:  Mouth/Throat: No oropharyngeal exudate.  Eyes: EOM are normal. Pupils are equal, round, and reactive to light.  Neck: Neck supple.  Cardiovascular: Normal rate, regular rhythm and normal heart sounds.   Pulmonary/Chest: Effort normal and breath sounds normal.  Abdominal: Soft. Bowel sounds are normal. There is no tenderness. There is no rebound.  Musculoskeletal: He exhibits no edema.  Lymphadenopathy:    He has no cervical adenopathy.       Assessment & Plan:

## 2015-04-14 NOTE — Addendum Note (Signed)
Addended by: Mariea ClontsGREEN, Wilberto Console D on: 04/14/2015 09:41 AM   Modules accepted: Orders

## 2015-04-14 NOTE — Assessment & Plan Note (Signed)
Encouraged to quit.  He defers chantix (makes my head goofy) and nicotine patches.

## 2015-04-14 NOTE — Assessment & Plan Note (Signed)
Encouraged him to try nasal strips ttrial of protonix If not improved, referal for sleep study

## 2015-04-14 NOTE — Assessment & Plan Note (Signed)
He is doing well.  Will recheck his labs today.  Offered/refused condoms.  Hep A/B immune PNVX next year rtc in 6 months.

## 2015-04-14 NOTE — Assessment & Plan Note (Signed)
Lab Results  Component Value Date   CHOL 213* 07/09/2014   HDL 57 07/09/2014   LDLCALC 124* 07/09/2014   TRIG 158* 07/09/2014   CHOLHDL 3.7 07/09/2014    Will cont to watch

## 2015-04-15 LAB — HIV-1 RNA QUANT-NO REFLEX-BLD: HIV-1 RNA Quant, Log: <1.3 Log copies/mL (ref <1.30)

## 2015-04-15 LAB — T-HELPER CELL (CD4) - (RCID CLINIC ONLY)
CD4 % Helper T Cell: 39 % (ref 33–55)
CD4 T Cell Abs: 1150 /uL (ref 400–2700)

## 2015-04-15 LAB — URINE CYTOLOGY ANCILLARY ONLY
Chlamydia: NEGATIVE
Neisseria Gonorrhea: NEGATIVE

## 2015-04-15 LAB — RPR

## 2015-05-14 NOTE — Progress Notes (Signed)
Notified [facility] via fax.  Yoshi Vicencio M, RN  

## 2015-06-27 ENCOUNTER — Other Ambulatory Visit: Payer: Self-pay | Admitting: Infectious Diseases

## 2015-07-23 ENCOUNTER — Other Ambulatory Visit: Payer: Self-pay | Admitting: Infectious Diseases

## 2015-08-04 ENCOUNTER — Ambulatory Visit: Payer: Self-pay

## 2015-08-07 ENCOUNTER — Other Ambulatory Visit: Payer: Self-pay | Admitting: *Deleted

## 2015-08-07 MED ORDER — ELVITEG-COBIC-EMTRICIT-TENOFDF 150-150-200-300 MG PO TABS: 1.0000 | ORAL_TABLET | Freq: Every day | ORAL | Status: DC

## 2015-09-01 ENCOUNTER — Other Ambulatory Visit (INDEPENDENT_AMBULATORY_CARE_PROVIDER_SITE_OTHER): Payer: Self-pay

## 2015-09-01 DIAGNOSIS — B20 Human immunodeficiency virus [HIV] disease: Secondary | ICD-10-CM

## 2015-09-02 LAB — HIV-1 RNA QUANT-NO REFLEX-BLD

## 2015-09-02 LAB — T-HELPER CELL (CD4) - (RCID CLINIC ONLY)
CD4 % Helper T Cell: 37 % (ref 33–55)
CD4 T CELL ABS: 980 /uL (ref 400–2700)

## 2015-09-04 ENCOUNTER — Telehealth: Payer: Self-pay | Admitting: *Deleted

## 2015-09-04 NOTE — Telephone Encounter (Signed)
Patient called to find out his lab results and informed me that [facility] no longer [facility]ifies [facility]; it will expire 09/19/15. Per Denny PeonErin, Oceanographerfinancial assistant he does qualify for [employer] but he will be responsible for his lab and MD visits. Patient is undetectable, however he will need to come before the end of the month to sign forms for [employer]. He has an upcoming appt with Dr. Ninetta LightsHatcher on 09/15/15 and can meet with Denny PeonErin or Marcelino DusterMichelle that day to sign the form. Going forth would he be able to meet with MD every year and get his labs done the same day? Please advise Matthew Tyler

## 2015-09-04 NOTE — Telephone Encounter (Signed)
That would be fine with me thanks

## 2015-09-05 ENCOUNTER — Other Ambulatory Visit: Payer: Self-pay | Admitting: Infectious Diseases

## 2015-09-05 DIAGNOSIS — B86 Scabies: Secondary | ICD-10-CM

## 2015-09-05 MED ORDER — PERMETHRIN 5 % EX CREA: 1.0000 "application " | TOPICAL_CREAM | Freq: Once | CUTANEOUS | Status: DC

## 2015-09-05 NOTE — Telephone Encounter (Signed)
Patient notified and he will keep upcoming appt and call Erin on Monday 09/08/15 so she can further explain [employer] to him.

## 2015-09-15 ENCOUNTER — Encounter: Payer: Self-pay | Admitting: Infectious Diseases

## 2015-09-15 ENCOUNTER — Ambulatory Visit (INDEPENDENT_AMBULATORY_CARE_PROVIDER_SITE_OTHER): Payer: Self-pay | Admitting: Infectious Diseases

## 2015-09-15 ENCOUNTER — Ambulatory Visit: Payer: Self-pay

## 2015-09-15 VITALS — BP 145/97 | HR 87 | Temp 98.1°F | Ht 70.0 in | Wt 147.0 lb

## 2015-09-15 DIAGNOSIS — E785 Hyperlipidemia, unspecified: Secondary | ICD-10-CM

## 2015-09-15 DIAGNOSIS — Z113 Encounter for screening for infections with a predominantly sexual mode of transmission: Secondary | ICD-10-CM

## 2015-09-15 DIAGNOSIS — F17200 Nicotine dependence, unspecified, uncomplicated: Secondary | ICD-10-CM

## 2015-09-15 DIAGNOSIS — B20 Human immunodeficiency virus [HIV] disease: Secondary | ICD-10-CM

## 2015-09-15 DIAGNOSIS — Z23 Encounter for immunization: Secondary | ICD-10-CM

## 2015-09-15 NOTE — Assessment & Plan Note (Signed)
Will recheck at his next visit.  Nl in October

## 2015-09-15 NOTE — Assessment & Plan Note (Addendum)
Offered/refused condoms.  Doing very well Will cont his current meds pnvx today Will meet with Marcelino DusterMichelle today for financial counseling.  Will see him back in 6 months with full labs

## 2015-09-15 NOTE — Progress Notes (Signed)
   Subjective:    Patient ID: Matthew Tyler, male    DOB: 06/13/1971, 45 y.o.   MRN: [id number]  HPI  45 yo  M with HIV+. 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 He was then changed to triumeq 06-2014 (genotype PR: A71T).   Has been busy with work this winter.  Has had some issues with insurance coverage, causing his stress. Getting [employer].  Sleep walking.   HIV 1 RNA QUANT (copies/mL)  Date Value  09/01/2015 <20  04/14/2015 <20  09/24/2014 28*   CD4 T CELL ABS (/uL)  Date Value  09/01/2015 980  04/14/2015 1150  09/24/2014 1000     Review of Systems  Constitutional: Negative for appetite change and unexpected weight change.  Respiratory: Negative for cough and shortness of breath.   Cardiovascular: Negative for chest pain.  Gastrointestinal: Negative for diarrhea and constipation.  Genitourinary: Negative for difficulty urinating and genital sores.  Neurological: Negative for headaches.       Objective:   Physical Exam  Constitutional: He appears well-developed and well-nourished.  HENT:  Mouth/Throat: No oropharyngeal exudate.  Eyes: EOM are normal. Pupils are equal, round, and reactive to light.  Neck: Neck supple.  Cardiovascular: Normal rate, regular rhythm and normal heart sounds.   Pulmonary/Chest: Effort normal and breath sounds normal.  Abdominal: Soft. Bowel sounds are normal. There is no rebound.  Musculoskeletal: He exhibits no edema.  Lymphadenopathy:    He has no cervical adenopathy.      Assessment & Plan:

## 2015-09-15 NOTE — Assessment & Plan Note (Signed)
Encouraged him to quit 

## 2015-10-01 ENCOUNTER — Other Ambulatory Visit: Payer: Self-pay

## 2015-10-15 ENCOUNTER — Ambulatory Visit: Payer: Self-pay | Admitting: Infectious Diseases

## 2015-11-23 ENCOUNTER — Other Ambulatory Visit: Payer: Self-pay | Admitting: Infectious Diseases

## 2015-11-23 DIAGNOSIS — B20 Human immunodeficiency virus [HIV] disease: Secondary | ICD-10-CM

## 2015-11-24 NOTE — Telephone Encounter (Signed)
Pt actually on stribild.

## 2016-03-17 ENCOUNTER — Telehealth: Payer: Self-pay | Admitting: *Deleted

## 2016-03-17 NOTE — Telephone Encounter (Signed)
Patient called for his last lab results, results given. He is over income for ADAP and [employer], but has been able to get his medication through Rx outreach program. He wanted to know the cost of labs out of pocket and was able to give him an estimate on the viral load from [facility] of around $300. He is hopeful that his situation may change and he will be able to get insurance.

## 2016-08-12 ENCOUNTER — Other Ambulatory Visit: Payer: Self-pay | Admitting: *Deleted

## 2016-08-12 DIAGNOSIS — B20 Human immunodeficiency virus [HIV] disease: Secondary | ICD-10-CM

## 2016-08-12 MED ORDER — ELVITEG-COBIC-EMTRICIT-TENOFDF 150-150-200-300 MG PO TABS: ORAL_TABLET | ORAL | 5 refills | Status: DC

## 2017-01-18 ENCOUNTER — Other Ambulatory Visit: Payer: Self-pay | Admitting: *Deleted

## 2017-01-18 DIAGNOSIS — B20 Human immunodeficiency virus [HIV] disease: Secondary | ICD-10-CM

## 2017-01-18 MED ORDER — ELVITEG-COBIC-EMTRICIT-TENOFDF 150-150-200-300 MG PO TABS: ORAL_TABLET | ORAL | 5 refills | Status: DC

## 2017-01-19 ENCOUNTER — Telehealth: Payer: Self-pay | Admitting: *Deleted

## 2017-01-19 ENCOUNTER — Other Ambulatory Visit: Payer: Self-pay | Admitting: Infectious Diseases

## 2017-01-19 DIAGNOSIS — B20 Human immunodeficiency virus [HIV] disease: Secondary | ICD-10-CM

## 2017-01-19 NOTE — Telephone Encounter (Signed)
Patient has not been seen since 08/2015.  He states he cannot come as he has been unable to find insurance (does not qualify for RW/ADAP).  He is working with Morrie SheldonAshley, renewing Patient Assistance for medication.  RN gave him the phone number for [employer] Assistance to cover office visits and labs. RN advised that patient needs to discuss this with the physician and come up with a plan together regarding office visits, medication refills - that once a patient has not been seen for 12+ months, the clinical staff is unable to fill prescriptions. It is deferred to the physician. Patient has scheduled an appointment for labs 9/24, follow up with Dr Ninetta LightsHatcher 10/8.    He has only missed 2 doses of medication.  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  Please advise on refills. Andree CossHowell, Muriel Wilber M, RN

## 2017-01-19 NOTE — Telephone Encounter (Signed)
Received request from pharmacy for med refill. Denied as last office visit was 08/2015. He does have an appt with Dr. Ninetta LightsHatcher in October, however patient should come in to see pharmacy in order to have his meds refilled. He cancelled his last appt in April 2017. Matthew MolaJacqueline Kingsten [place]

## 2017-01-21 ENCOUNTER — Other Ambulatory Visit: Payer: Self-pay | Admitting: Pharmacist Clinician (PhC)/ Clinical Pharmacy Specialist

## 2017-01-21 DIAGNOSIS — B20 Human immunodeficiency virus [HIV] disease: Secondary | ICD-10-CM

## 2017-01-21 NOTE — Progress Notes (Signed)
labs

## 2017-01-21 NOTE — Telephone Encounter (Signed)
Patient called back and wanted to know if he will be ok off of his hiv meds x four days. He wanted to know if we had samples for the Stribild. Advised we do not have samples, but hopefully Morrie SheldonAshley, patient Oceanographerfinancial assistant, can assist in getting him this medication. I did explain that he should be ok, as his labs have been good, and that it is better to just stop (versus taking the medication sporadically). Suggested that he share these concerns with the pharmacist when he comes in on Monday 01/24/17. Advised him to come at 11:00 AM, so that he can speak to [place]ley first.

## 2017-01-24 ENCOUNTER — Ambulatory Visit (INDEPENDENT_AMBULATORY_CARE_PROVIDER_SITE_OTHER): Payer: Self-pay | Admitting: Pharmacist Clinician (PhC)/ Clinical Pharmacy Specialist

## 2017-01-24 DIAGNOSIS — B20 Human immunodeficiency virus [HIV] disease: Secondary | ICD-10-CM

## 2017-01-24 MED ORDER — BICTEGRAVIR-EMTRICITAB-TENOFOV 50-200-25 MG PO TABS: 1.0000 | ORAL_TABLET | Freq: Every day | ORAL | 5 refills | Status: DC

## 2017-01-24 NOTE — Patient Instructions (Signed)
Stop Stribild completely Start Biktarvy 1 daily Labs today Follow up with Dr. Ninetta LightsHatcher in Oct

## 2017-01-24 NOTE — Progress Notes (Signed)
HPI: Matthew LeschDavid Matthew Tyler Tyler is a 46 y.o. male who is here to see pharmacy for issue with getting medication.   Allergies: Allergies  Allergen Reactions  . Penicillins     Vitals:    Past Medical History: Past Medical History:  Diagnosis Date  . Arthritis   . HIV disease [facility](HCC)     Social History: Social History   Social History  . Marital status: Single    Spouse name: N/A  . Number of children: N/A  . Years of education: N/A   Social History Main Topics  . Smoking status: Current Every Day Smoker    Packs/day: 0.50    Years: 32.00    Types: Cigarettes    Last attempt to quit: 09/19/2012  . Smokeless tobacco: Never Used  . Alcohol use 0.0 oz/week     Comment: social  . Drug use: No  . Sexual activity: No     Comment: declined condoms   Other Topics Concern  . Not on file   Social History Narrative  . No narrative on file    Previous Regimen: NVP/TRV  Current Regimen: Stribild  Labs: HIV 1 RNA Quant (copies/mL)  Date Value  09/01/2015 <20  04/14/2015 <20  09/24/2014 28 (H)   CD4 T Cell Abs (/uL)  Date Value  09/01/2015 980  04/14/2015 1,150  09/24/2014 1,000   Hep B S Ab (no units)  Date Value  09/27/2013 POS (A)   Hepatitis B Surface Ag (no units)  Date Value  09/27/2013 NEGATIVE   HCV Ab (no units)  Date Value  09/27/2013 REACTIVE (A)    CrCl: CrCl cannot be calculated (Patient's most recent lab result is older than the maximum 21 days allowed.).  Lipids:    Component Value Date/Time   CHOL 183 04/14/2015 0941   TRIG 109 04/14/2015 0941   HDL 50 04/14/2015 0941   CHOLHDL 3.7 04/14/2015 0941   VLDL 22 04/14/2015 0941   LDLCALC 111 04/14/2015 0941    Assessment: Matthew Tyler has had a hx with HIV since 2005 where he was placed on NVP/TRV before he was transferring her from [place]. He was switched to Triumeq>>Stribild. He is in a unique situation where he can't get ADAP due to income (low 40s) and can't afford insurance through the exchange.  Therefore, he has gotten the previous supply through [employer]. Morrie Sheldonshley pulled me in on Friday to see if we can see him because he has not been seen since 2017. Apparently, his income may be increasing a little now but I'm not sure if this will change his affordability for insurance. He has been approved for assistance through [employer] for Stribild. Since he is already approved, discuss with him about changing the Stribild to [employer]. He said that the Stribild may have caused some of his sleep walking. It's probably unlikely but he agreed to the change. He ran of Stribild about 8 days ago but he was very compliant while he was on therapy. [facility] so they can mail to him. We will get a VL with reflex today only since this is a no charge appt. He has a lab appt in late sept before the visit with Dr. Ninetta LightsHatcher, therefore, this will be perfect timing for labs on Bitkarvy.    Recommendations:  Dc Stribild Start Biktarvy 1 PO qday HIV VL today Labs appt 9/24 F/u with Dr. Ninetta LightsHatcher in Oct.  Johm Pfannenstiel, PharmD, BCPS, AAHIVP, CPP Clinical Infectious Disease Pharmacist  [facility] 01/24/2017, 11:44 AM

## 2017-01-30 LAB — HIV RNA, RTPCR W/R GT (RTI, PI,INT)
HIV-1 RNA, QN PCR: 1.61 {Log_copies}/mL — AB
HIV-1 RNA, QN PCR: 41 {copies}/mL — AB

## 2017-03-14 ENCOUNTER — Other Ambulatory Visit: Payer: Self-pay

## 2017-03-14 DIAGNOSIS — B20 Human immunodeficiency virus [HIV] disease: Secondary | ICD-10-CM

## 2017-03-15 LAB — SYPHILIS: RPR W/REFLEX TO RPR TITER AND TREPONEMAL ANTIBODIES, TRADITIONAL SCREENING AND DIAGNOSIS ALGORITHM: RPR Ser Ql: NONREACTIVE

## 2017-03-15 LAB — BASIC METABOLIC PANEL
BUN: 14 mg/dL (ref 7–25)
CALCIUM: 9.5 mg/dL (ref 8.6–10.3)
CO2: 26 mmol/L (ref 20–32)
CREATININE: 0.97 mg/dL (ref 0.60–1.35)
Chloride: 102 mmol/L (ref 98–110)
GLUCOSE: 111 mg/dL — AB (ref 65–99)
Potassium: 4.5 mmol/L (ref 3.5–5.3)
Sodium: 136 mmol/L (ref 135–146)

## 2017-03-15 LAB — CBC
HCT: 43.5 % (ref 38.5–50.0)
Hemoglobin: 14.9 g/dL (ref 13.2–17.1)
MCH: 32.7 pg (ref 27.0–33.0)
MCHC: 34.3 g/dL (ref 32.0–36.0)
MCV: 95.6 fL (ref 80.0–100.0)
MPV: 11 fL (ref 7.5–12.5)
Platelets: 228 Thousand/uL (ref 140–400)
RBC: 4.55 Million/uL (ref 4.20–5.80)
RDW: 12.9 % (ref 11.0–15.0)
WBC: 7.7 Thousand/uL (ref 3.8–10.8)

## 2017-03-15 LAB — T-HELPER CELL (CD4) - (RCID CLINIC ONLY)
CD4 T CELL HELPER: 41 % (ref 33–55)
CD4 T Cell Abs: 930 /uL (ref 400–2700)

## 2017-03-19 LAB — HIV RNA, RTPCR W/R GT (RTI, PI,INT)
HIV 1 RNA QUANT: 184 {copies}/mL — AB
HIV-1 RNA QUANT, LOG: 2.26 {Log_copies}/mL — AB

## 2017-03-21 ENCOUNTER — Telehealth: Payer: Self-pay | Admitting: *Deleted

## 2017-03-21 NOTE — Telephone Encounter (Signed)
Patient called and advised he is concerned about his Viral load going up since starting new medication. He wants the provider to know his concern.

## 2017-03-28 ENCOUNTER — Ambulatory Visit (INDEPENDENT_AMBULATORY_CARE_PROVIDER_SITE_OTHER): Payer: Self-pay | Admitting: Infectious Diseases

## 2017-03-28 ENCOUNTER — Encounter: Payer: Self-pay | Admitting: Infectious Diseases

## 2017-03-28 VITALS — BP 145/91 | HR 86 | Temp 98.5°F | Wt 148.0 lb

## 2017-03-28 DIAGNOSIS — Z113 Encounter for screening for infections with a predominantly sexual mode of transmission: Secondary | ICD-10-CM

## 2017-03-28 DIAGNOSIS — Z79899 Other long term (current) drug therapy: Secondary | ICD-10-CM

## 2017-03-28 DIAGNOSIS — F17200 Nicotine dependence, unspecified, uncomplicated: Secondary | ICD-10-CM

## 2017-03-28 DIAGNOSIS — Z23 Encounter for immunization: Secondary | ICD-10-CM

## 2017-03-28 DIAGNOSIS — B20 Human immunodeficiency virus [HIV] disease: Secondary | ICD-10-CM

## 2017-03-28 MED ORDER — ELVITEG-COBIC-EMTRICIT-TENOFAF 150-150-200-10 MG PO TABS: 1.0000 | ORAL_TABLET | Freq: Every day | ORAL | 3 refills | Status: DC

## 2017-03-28 NOTE — Assessment & Plan Note (Addendum)
Will change him to genvoya D/i pharm Offered/refused condoms.  Given flu shot.  Will see if he can get pharm help, assistance with visit cost rtc in 6 months.

## 2017-03-28 NOTE — Progress Notes (Signed)
   Subjective:    Patient ID: Matthew Tyler, male    DOB: 30-Dec-1970, 46 y.o.   MRN: [id number]  HPI 46 yo  M with HIV+. 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 He was then changed to triumeq 06-2014 (genotype PR: A71T). Changed to stribild then biktarvy at last visit (01-2017).  He is interested in going back to genvoya/stribild. He has more out of pocket expenses now.    HIV 1 RNA Quant (copies/mL)  Date Value  03/14/2017 184 (H)  09/01/2015 <20  04/14/2015 <20   CD4 T Cell Abs (/uL)  Date Value  03/14/2017 930  09/01/2015 980  04/14/2015 1,150    Review of Systems  Constitutional: Negative for appetite change and unexpected weight change.  Eyes: Negative for visual disturbance.  Respiratory: Negative for cough.   Gastrointestinal: Negative for constipation and diarrhea.  Genitourinary: Negative for difficulty urinating.  Psychiatric/Behavioral: Negative for sleep disturbance.  has been sleepwalking again.  Please see HPI. 12 point ROS o/w (-)  Had colon > 20 yrs ago, ? Polyps.     Objective:   Physical Exam  Constitutional: He appears well-developed and well-nourished.  HENT:  Mouth/Throat: No oropharyngeal exudate.  Eyes: Pupils are equal, round, and reactive to light. EOM are normal.  Neck: Neck supple.  Cardiovascular: Normal rate, regular rhythm and normal heart sounds.   Pulmonary/Chest: Effort normal and breath sounds normal.  Abdominal: Soft. Bowel sounds are normal. There is no tenderness. There is no rebound.  Musculoskeletal: He exhibits no edema.  Lymphadenopathy:    He has no cervical adenopathy.  Psychiatric: He has a normal mood and affect.       Assessment & Plan:

## 2017-03-28 NOTE — Assessment & Plan Note (Signed)
Encouraged to quit. 

## 2017-03-28 NOTE — Progress Notes (Signed)
Subjective:    Patient ID: Matthew Tyler is a 46 y.o. male.  Chief Complaint:  Data Review:   ROS  Objective:  Physical Exam  Laboratory:    Assessment:     Plan:

## 2017-03-29 ENCOUNTER — Other Ambulatory Visit: Payer: Self-pay

## 2017-05-09 ENCOUNTER — Other Ambulatory Visit: Payer: Self-pay

## 2017-05-09 DIAGNOSIS — B20 Human immunodeficiency virus [HIV] disease: Secondary | ICD-10-CM

## 2017-05-09 DIAGNOSIS — Z113 Encounter for screening for infections with a predominantly sexual mode of transmission: Secondary | ICD-10-CM

## 2017-05-09 DIAGNOSIS — Z79899 Other long term (current) drug therapy: Secondary | ICD-10-CM

## 2017-05-10 LAB — COMPREHENSIVE METABOLIC PANEL
AG RATIO: 1.7 (calc) (ref 1.0–2.5)
ALKALINE PHOSPHATASE (APISO): 40 U/L (ref 40–115)
ALT: 13 U/L (ref 9–46)
AST: 25 U/L (ref 10–40)
Albumin: 4.5 g/dL (ref 3.6–5.1)
BILIRUBIN TOTAL: 0.5 mg/dL (ref 0.2–1.2)
BUN: 10 mg/dL (ref 7–25)
CO2: 26 mmol/L (ref 20–32)
CREATININE: 0.94 mg/dL (ref 0.60–1.35)
Calcium: 9.5 mg/dL (ref 8.6–10.3)
Chloride: 103 mmol/L (ref 98–110)
GLOBULIN: 2.7 g/dL (ref 1.9–3.7)
GLUCOSE: 91 mg/dL (ref 65–99)
POTASSIUM: 4.4 mmol/L (ref 3.5–5.3)
SODIUM: 139 mmol/L (ref 135–146)
Total Protein: 7.2 g/dL (ref 6.1–8.1)

## 2017-05-10 LAB — T-HELPER CELL (CD4) - (RCID CLINIC ONLY)
CD4 T CELL ABS: 1340 /uL (ref 400–2700)
CD4 T CELL HELPER: 44 % (ref 33–55)

## 2017-05-10 LAB — CBC
HEMATOCRIT: 42.5 % (ref 38.5–50.0)
HEMOGLOBIN: 15 g/dL (ref 13.2–17.1)
MCH: 33.7 pg — AB (ref 27.0–33.0)
MCHC: 35.3 g/dL (ref 32.0–36.0)
MCV: 95.5 fL (ref 80.0–100.0)
MPV: 10.7 fL (ref 7.5–12.5)
Platelets: 218 10*3/uL (ref 140–400)
RBC: 4.45 10*6/uL (ref 4.20–5.80)
RDW: 13.1 % (ref 11.0–15.0)
WBC: 7.9 10*3/uL (ref 3.8–10.8)

## 2017-05-10 LAB — LIPID PANEL
CHOLESTEROL: 210 mg/dL — AB (ref <200)
HDL: 56 mg/dL (ref >40)
LDL CHOLESTEROL (CALC): 127 mg/dL — AB
Non-HDL Cholesterol (Calc): 154 mg/dL (calc) — ABNORMAL HIGH (ref <130)
TRIGLYCERIDES: 155 mg/dL — AB (ref <150)
Total CHOL/HDL Ratio: 3.8 (calc) (ref <5.0)

## 2017-05-10 LAB — RPR: RPR Ser Ql: NONREACTIVE

## 2017-05-11 LAB — HIV-1 RNA QUANT-NO REFLEX-BLD
HIV 1 RNA Quant: 182 copies/mL — ABNORMAL HIGH
HIV-1 RNA Quant, Log: 2.26 Log copies/mL — ABNORMAL HIGH

## 2017-06-09 ENCOUNTER — Telehealth: Payer: Self-pay | Admitting: *Deleted

## 2017-06-09 NOTE — Telephone Encounter (Signed)
Patient called for results of his November labs. Advised him of his viral load and CD4. He was disappointed that there was not much change in his viral load and that he is still detectable at 182. He stated that for years he was undetectable on the Stribild and since switching medications that has changed. Asked if he wanted an appt to discuss, as his next MD appt is not until April. He said he can not do this, as his last lab visit left him with a bill of $800. He thanked me and said he would wait and discuss with Dr. Ninetta LightsHatcher at his next follow up.

## 2017-07-18 ENCOUNTER — Telehealth: Payer: Self-pay | Admitting: Pharmacy Technician

## 2017-07-18 NOTE — Telephone Encounter (Signed)
Mr. Matthew Tyler must buy insurance based on his income.  [employer] mailed him a letter stating this.  I cannot reach him, phone has no voicemeail.

## 2017-07-25 ENCOUNTER — Telehealth: Payer: Self-pay | Admitting: Pharmacist Clinician (PhC)/ Clinical Pharmacy Specialist

## 2017-07-25 NOTE — Telephone Encounter (Signed)
Matthew Tyler has about 4 doses of his Genvoya left. He is the one that makes too much money for ADAP, [employer], or PAP. He has already missed the enrollment period for the exchange, therefore, the only plan he can get right now is the minimal plans. However, those plans have exclusion and HIV is one of them. D/w Dr. Ninetta LightsHatcher, we will use ATP until he enroll into a plan in Nov. [place]

## 2017-07-27 ENCOUNTER — Ambulatory Visit (INDEPENDENT_AMBULATORY_CARE_PROVIDER_SITE_OTHER): Payer: Self-pay | Admitting: Pharmacist Clinician (PhC)/ Clinical Pharmacy Specialist

## 2017-07-27 DIAGNOSIS — B20 Human immunodeficiency virus [HIV] disease: Secondary | ICD-10-CM

## 2017-07-27 MED ORDER — EFAVIRENZ-EMTRICITAB-TENOFOVIR 600-200-300 MG PO TABS: 1.0000 | ORAL_TABLET | Freq: Every day | ORAL | 11 refills | Status: DC

## 2017-07-27 NOTE — Progress Notes (Signed)
HPI: Jearld LeschDavid [name] Shafer is a 47 y.o. male who is here to see pharmacy to deal with his insurance and ART issue.   Allergies: Allergies  Allergen Reactions  . Penicillins     Vitals:    Past Medical History: Past Medical History:  Diagnosis Date  . Arthritis   . HIV disease (HCC)     Social History: Social History   Socioeconomic History  . Marital status: Single    Spouse name: Not on file  . Number of children: Not on file  . Years of education: Not on file  . Highest education level: Not on file  Social Needs  . Financial resource strain: Not on file  . Food insecurity - worry: Not on file  . Food insecurity - inability: Not on file  . Transportation needs - medical: Not on file  . Transportation needs - non-medical: Not on file  Occupational History  . Not on file  Tobacco Use  . Smoking status: Current Every Day Smoker    Packs/day: 0.50    Years: 32.00    Pack years: 16.00    Types: Cigarettes    Last attempt to quit: 09/19/2012    Years since quitting: 4.8  . Smokeless tobacco: Never Used  Substance and Sexual Activity  . Alcohol use: Yes    Alcohol/week: 0.0 oz    Comment: social  . Drug use: No  . Sexual activity: No    Partners: Male    Comment: declined condoms  Other Topics Concern  . Not on file  Social History Narrative  . Not on file    Previous Regimen: NVP/TRV>>Stribild>>Genvoya  Current Regimen: Genvoya  Labs: HIV 1 RNA Quant (copies/mL)  Date Value  05/09/2017 182 (H)  03/14/2017 184 (H)  09/01/2015 <20   CD4 T Cell Abs (/uL)  Date Value  05/09/2017 1,340  03/14/2017 930  09/01/2015 980   Hep B S Ab (no units)  Date Value  09/27/2013 POS (A)   Hepatitis B Surface Ag (no units)  Date Value  09/27/2013 NEGATIVE   HCV Ab (no units)  Date Value  09/27/2013 REACTIVE (A)    CrCl: CrCl cannot be calculated (Patient's most recent lab result is older than the maximum 21 days allowed.).  Lipids:    Component Value  Date/Time   CHOL 210 (H) 05/09/2017 1115   TRIG 155 (H) 05/09/2017 1115   HDL 56 05/09/2017 1115   CHOLHDL 3.8 05/09/2017 1115   VLDL 22 04/14/2015 0941   LDLCALC 111 04/14/2015 0941    Assessment: [name] is here to see with pharmacy to see about acquiring his ART. He currently on Genvoya with 1 tablet left. He doesn't have insurance but makes too much for ADAP or patient assistance. Previously, he made around $54k/year but it has increase to mid 80s now. Therefore, he can't get it any where without paying cash for it. He didn't enroll into the exchange during the enrollment period of Nov-Dec. He tried to sign up for a plan after the enrollment, however, these plans usually provide minimal coverage and and have and exclusion. In this case HIV. Therefore, he must enroll into the exchange program in Nov. We will provide Atripla until then. With his hx of adherence, he should be perfectly fine on ATP. He is very conscious about the cost of his visits. He has an appt with Dr. Ninetta LightsHatcher April and a lab visit a week earlier. We will get labs that visit. He will come  back in 4 months to see pharmacy to get more ATP.   Recommendations:  Start Atripla 1 daily at bedtime F/u with labs in March F/u with Dr. Ninetta Lights in April  Ulyses Southward, PharmD, BCPS, AAHIVP, CPP Clinical Infectious Disease Pharmacist [facility] 07/27/2017, 2:49 PM

## 2017-09-12 ENCOUNTER — Other Ambulatory Visit: Payer: Self-pay

## 2017-09-12 DIAGNOSIS — B20 Human immunodeficiency virus [HIV] disease: Secondary | ICD-10-CM

## 2017-09-13 LAB — T-HELPER CELL (CD4) - (RCID CLINIC ONLY)
CD4 T CELL ABS: 980 /uL (ref 400–2700)
CD4 T CELL HELPER: 42 % (ref 33–55)

## 2017-09-15 LAB — HIV-1 RNA QUANT-NO REFLEX-BLD
HIV 1 RNA QUANT: 43 {copies}/mL — AB
HIV-1 RNA QUANT, LOG: 1.63 {Log_copies}/mL — AB

## 2017-09-26 ENCOUNTER — Ambulatory Visit (INDEPENDENT_AMBULATORY_CARE_PROVIDER_SITE_OTHER): Payer: Self-pay | Admitting: Infectious Diseases

## 2017-09-26 ENCOUNTER — Encounter: Payer: Self-pay | Admitting: Infectious Diseases

## 2017-09-26 DIAGNOSIS — I10 Essential (primary) hypertension: Secondary | ICD-10-CM

## 2017-09-26 DIAGNOSIS — Z789 Other specified health status: Secondary | ICD-10-CM | POA: Insufficient documentation

## 2017-09-26 DIAGNOSIS — B20 Human immunodeficiency virus [HIV] disease: Secondary | ICD-10-CM

## 2017-09-26 DIAGNOSIS — F17200 Nicotine dependence, unspecified, uncomplicated: Secondary | ICD-10-CM

## 2017-09-26 NOTE — Progress Notes (Signed)
   Subjective:    Patient ID: Matthew Tyler, male Matthew Tyler   DOB: 12/02/1970, 47 y.o.   MRN: [id number]  HPI 47 yo M with HIV+. 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 He was then changed to triumeq 06-2014 (genotype PR: A71T). Changed to stribild then biktarvy at last visit (01-2017).  At his last visit he was changed back to atripla (given 4 months samples) due to out of pocket costs. He will have health insurance again in November.  His work has been Tax adviserchanging, same company.  Has bene taking new certification exams.  Dreams have been vivid, not sleep walking.   HIV 1 RNA Quant (copies/mL)  Date Value  09/12/2017 43 (H)  05/09/2017 182 (H)  03/14/2017 184 (H)   CD4 T Cell Abs (/uL)  Date Value  09/12/2017 980  05/09/2017 1,340  03/14/2017 930    Review of Systems  Constitutional: Negative for appetite change and unexpected weight change.  Respiratory: Negative for cough.   Cardiovascular: Negative for chest pain.  Gastrointestinal: Negative for constipation and diarrhea.  Genitourinary: Negative for difficulty urinating.  Neurological: Negative for headaches.  Please see HPI. All other systems reviewed and negative.      Objective:   Physical Exam  Constitutional: He appears well-developed and well-nourished.  HENT:  Mouth/Throat: No oropharyngeal exudate.  Eyes: Pupils are equal, round, and reactive to light. EOM are normal.  Neck: Neck supple.  Cardiovascular: Normal rate, regular rhythm and normal heart sounds.  Pulmonary/Chest: Effort normal and breath sounds normal.  Abdominal: Soft. Bowel sounds are normal. There is no tenderness. There is no rebound.  Musculoskeletal: He exhibits no edema.  Lymphadenopathy:    He has no cervical adenopathy.  Psychiatric: He has a normal mood and affect.          Assessment & Plan:

## 2017-09-26 NOTE — Assessment & Plan Note (Signed)
will see him back in 9 months Offered/refused condoms.  He has sig stress from not having health insurance. Fears of losing his meds.  I reassured him that we will help him with his bills and obtaining his meds.

## 2017-09-26 NOTE — Progress Notes (Signed)
Patient ID: Matthew Tyler, male   DOB: 05/07/1971, 47 y.o.   MRN: [id number]   error

## 2017-09-26 NOTE — Assessment & Plan Note (Signed)
Will get him onto quit line, patches.

## 2017-09-26 NOTE — Assessment & Plan Note (Signed)
He is asx Will continue to monitor.

## 2017-11-07 ENCOUNTER — Ambulatory Visit (INDEPENDENT_AMBULATORY_CARE_PROVIDER_SITE_OTHER): Payer: Self-pay | Admitting: Pharmacist Clinician (PhC)/ Clinical Pharmacy Specialist

## 2017-11-07 DIAGNOSIS — B20 Human immunodeficiency virus [HIV] disease: Secondary | ICD-10-CM

## 2017-11-07 NOTE — Progress Notes (Signed)
HPI: Matthew Tyler is a 47 y.o. male who is here to pick up his meds from pharmacy.   Allergies: Allergies  Allergen Reactions  . Penicillins     Vitals:    Past Medical History: Past Medical History:  Diagnosis Date  . Arthritis   . HIV disease (HCC)     Social History: Social History   Socioeconomic History  . Marital status: Single    Spouse name: Not on file  . Number of children: Not on file  . Years of education: Not on file  . Highest education level: Not on file  Occupational History  . Not on file  Social Needs  . Financial resource strain: Not on file  . Food insecurity:    Worry: Not on file    Inability: Not on file  . Transportation needs:    Medical: Not on file    Non-medical: Not on file  Tobacco Use  . Smoking status: Current Every Day Smoker    Packs/day: 1.00    Years: 32.00    Pack years: 32.00    Types: Cigarettes    Last attempt to quit: 09/19/2012    Years since quitting: 5.1  . Smokeless tobacco: Never Used  Substance and Sexual Activity  . Alcohol use: Yes    Alcohol/week: 0.0 oz    Comment: social  . Drug use: No  . Sexual activity: Never    Partners: Male    Comment: declined condoms  Lifestyle  . Physical activity:    Days per week: Not on file    Minutes per session: Not on file  . Stress: Not on file  Relationships  . Social connections:    Talks on phone: Not on file    Gets together: Not on file    Attends religious service: Not on file    Active member of club or organization: Not on file    Attends meetings of clubs or organizations: Not on file    Relationship status: Not on file  Other Topics Concern  . Not on file  Social History Narrative  . Not on file    Previous Regimen: NVP/TRV>>Stribild>>Genvoya   Current Regimen: Atripla  Labs: HIV 1 RNA Quant (copies/mL)  Date Value  09/12/2017 43 (H)  05/09/2017 182 (H)  03/14/2017 184 (H)   CD4 T Cell Abs (/uL)  Date Value  09/12/2017 980   05/09/2017 1,340  03/14/2017 930   Hep B S Ab (no units)  Date Value  09/27/2013 POS (A)   Hepatitis B Surface Ag (no units)  Date Value  09/27/2013 NEGATIVE   HCV Ab (no units)  Date Value  09/27/2013 REACTIVE (A)    CrCl: CrCl cannot be calculated (Patient's most recent lab result is older than the maximum 21 days allowed.).  Lipids:    Component Value Date/Time   CHOL 210 (H) 05/09/2017 1115   TRIG 155 (H) 05/09/2017 1115   HDL 56 05/09/2017 1115   CHOLHDL 3.8 05/09/2017 1115   VLDL 22 04/14/2015 0941   LDLCALC 127 (H) 05/09/2017 1115    Assessment: Matthew Tyler is doing well on his Atripla. He had to be on this because of the lack of insurance. He makes too much money for [employer]. He has to get insurance through the exchange in Nov. This is a must and he knows that. Without the exchange, most insurance will exclude him due to the diagnosis. He has enough supply that will cover him until about Dec.  He will come back then and we can get one more month of med until his insurance is active in Jan. We can do either [employer] or [employer] card.  Recommendations:  Continue Atripla 1 daily F/u with pharmacy in Nov  Minh Pham, PharmD, BCPS, AAHIVP, CPP Clinical Infectious Disease Pharmacist [facility] 11/07/2017, 9:26 AM

## 2018-03-08 ENCOUNTER — Telehealth: Payer: Self-pay

## 2018-03-08 ENCOUNTER — Telehealth: Payer: Self-pay | Admitting: *Deleted

## 2018-03-08 NOTE — Telephone Encounter (Signed)
Patient is concerned about one-sided flank pain, worried that Atripla is causing kidney failure, states that his sister told him it would happen suddenly.  RN offered to have Cassie call patient to discuss side effects of Atripla, explain the tenofovir component in relation to kidney function. Patient accepted. Patient does not have insurance, will have to purchase through market place next month.  He knows he is on atripla temporarily due to coverage/out of pocket cost. He is concerned about the [employer] insurance cost.  He will speak with Olegario MessierKathy about options for that (PCAP?).   RN also asked patient to go to an [facility] to check blood work and to rule out anything else, like kidney stones.  He agreed.  Andree CossHowell, Jeniece Hannis M, RN

## 2018-03-08 NOTE — Telephone Encounter (Signed)
Patient is having kidney discomfort and feels this may be related to Atripla. Please call the patient .   He states Minh started medications.   [phone]

## 2018-03-08 NOTE — Telephone Encounter (Signed)
Called patient back to discuss and there was no answer. No voicemail.

## 2018-03-09 NOTE — Telephone Encounter (Signed)
Will keep trying

## 2018-03-09 NOTE — Telephone Encounter (Signed)
Thanks for calling

## 2018-05-03 ENCOUNTER — Ambulatory Visit: Payer: Self-pay

## 2018-05-04 ENCOUNTER — Ambulatory Visit (INDEPENDENT_AMBULATORY_CARE_PROVIDER_SITE_OTHER): Payer: Self-pay | Admitting: Pharmacist

## 2018-05-04 DIAGNOSIS — B20 Human immunodeficiency virus [HIV] disease: Secondary | ICD-10-CM

## 2018-05-04 NOTE — Progress Notes (Signed)
HPI: Matthew Tyler is a 47 y.o. male presenting for HIV follow up. He is currently uninsured and requiring medication until he is able to get insurance.  Patient Active Problem List   Diagnosis Date Noted  . Hypertension 09/26/2017  . Hepatitis B immune 09/26/2017  . Hepatitis A immune 09/26/2017  . Hyperlipidemia 10/08/2014  . Tobacco use disorder 10/08/2014  . Wrist pain, left 07/09/2014  . HIV disease (HCC) 03/06/2014  . Allergic sinusitis 12/10/2013  . Arthritis 10/11/2013    Patient's Medications  New Prescriptions   No medications on file  Previous Medications   EFAVIRENZ-EMTRICITABINE-TENOFOVIR (ATRIPLA) 600-200-300 MG TABLET    Take 1 tablet by mouth at bedtime.   LEMON OIL OIL    by Does not apply route.   MULTIPLE VITAMIN (MULTIVITAMIN) CAPSULE    Take 1 capsule by mouth daily.   NON FORMULARY    1 drop Nightly. On Guard essential oil   NONFORMULARY OR COMPOUNDED ITEM    Take 1 capsule by mouth. On Guard Plus capsule   OMEGA-3 FATTY ACIDS (OMEGA 3 PO)    Take 1 capsule by mouth.   PEPPERMINT OIL LIQUID    by Does not apply route as needed.  Modified Medications   No medications on file  Discontinued Medications   No medications on file    Allergies: Allergies  Allergen Reactions  . Penicillins     Past Medical History: Past Medical History:  Diagnosis Date  . Arthritis   . HIV disease (HCC)     Social History: Social History   Socioeconomic History  . Marital status: Single    Spouse name: Not on file  . Number of children: Not on file  . Years of education: Not on file  . Highest education level: Not on file  Occupational History  . Not on file  Social Needs  . Financial resource strain: Not on file  . Food insecurity:    Worry: Not on file    Inability: Not on file  . Transportation needs:    Medical: Not on file    Non-medical: Not on file  Tobacco Use  . Smoking status: Current Every Day Smoker    Packs/day: 1.00    Years: 32.00     Pack years: 32.00    Types: Cigarettes    Last attempt to quit: 09/19/2012    Years since quitting: 5.6  . Smokeless tobacco: Never Used  Substance and Sexual Activity  . Alcohol use: Yes    Alcohol/week: 0.0 standard drinks    Comment: social  . Drug use: No  . Sexual activity: Never    Partners: Male    Comment: declined condoms  Lifestyle  . Physical activity:    Days per week: Not on file    Minutes per session: Not on file  . Stress: Not on file  Relationships  . Social connections:    Talks on phone: Not on file    Gets together: Not on file    Attends religious service: Not on file    Active member of club or organization: Not on file    Attends meetings of clubs or organizations: Not on file    Relationship status: Not on file  Other Topics Concern  . Not on file  Social History Narrative  . Not on file    Labs: Lab Results  Component Value Date   HIV1RNAQUANT 43 (H) 09/12/2017   HIV1RNAQUANT 182 (H) 05/09/2017   HIV1RNAQUANT  184 (H) 03/14/2017   CD4TABS 980 09/12/2017   CD4TABS 1,340 05/09/2017   CD4TABS 930 03/14/2017    RPR and STI Lab Results  Component Value Date   LABRPR NON-REACTIVE 05/09/2017   LABRPR NON-REACTIVE 03/14/2017   LABRPR NON REAC 04/14/2015   LABRPR NON REAC 07/09/2014   LABRPR NON REAC 09/27/2013    STI Results GC CT  04/14/2015 Negative Negative  07/09/2014 NG: Negative CT: Negative  10/11/2013 NG: Negative CT: Negative    Hepatitis B Lab Results  Component Value Date   HEPBSAB POS (A) 09/27/2013   HEPBSAG NEGATIVE 09/27/2013   HEPBCAB NON REACTIVE 09/27/2013   Hepatitis C No results found for: HEPCAB, HCVRNAPCRQN Hepatitis A Lab Results  Component Value Date   HAV REACTIVE (A) 09/27/2013   Lipids: Lab Results  Component Value Date   CHOL 210 (H) 05/09/2017   TRIG 155 (H) 05/09/2017   HDL 56 05/09/2017   CHOLHDL 3.8 05/09/2017   VLDL 22 04/14/2015   LDLCALC 127 (H) 05/09/2017    Current HIV  Regimen: Atripla  Assessment: Matthew Tyler is here to discuss options for getting HIV medication until he is able to get insurance through open enrollment. His employer does not offer health insurance and he makes too much money for [employer]. He has also previously used manufacturer assistance and is no longer eligible. He has been taking Atripla that has been provided to him from [facility] since February. He has been researching insurance plans on open enrollment and says he can't find anything with less than a $700 monthly premium and $7500 deductible, which he cannot afford. He has also contacted several insurance companies directly who have told him that they will not cover him since he has HIV.   We gave Matthew Tyler our last 2 bottles of Atripla and provided him some information about availability of counselors to help navigate open enrollment and find cheaper options. We encouraged him to attend one of these sessions and to keep us updated. We have exhausted all other assistance options, so if Matthew Tyler is to continue taking medication, he needs to get insurance. We plan to switch him to a different medication, likely Biktarvy, once he has insurance. Matthew Tyler has taken this in the past and said that it made him sleepwalk, but he is willing to try it again.   Matthew Tyler has an appointment scheduled with Dr. Ninetta LightsHatcher in January. Since he is paying for labs out of pocket, we will defer for today and f/u prior to his appointment in January.  Plan: F/u insurance coverage Next f/u appointment with Dr. Ninetta LightsHatcher on 1/24 at [facility]  Arabell Neria N. Zigmund Danieleja, PharmD PGY2 Infectious Diseases Pharmacy Resident Phone: [phone] 05/04/2018, 11:15 AM

## 2018-06-26 ENCOUNTER — Other Ambulatory Visit: Payer: Self-pay

## 2018-06-29 ENCOUNTER — Other Ambulatory Visit: Payer: Self-pay

## 2018-07-04 ENCOUNTER — Other Ambulatory Visit: Payer: Self-pay

## 2018-07-04 DIAGNOSIS — B20 Human immunodeficiency virus [HIV] disease: Secondary | ICD-10-CM

## 2018-07-05 LAB — T-HELPER CELL (CD4) - (RCID CLINIC ONLY)
CD4 % Helper T Cell: 42 % (ref 33–55)
CD4 T Cell Abs: 980 /uL (ref 400–2700)

## 2018-07-06 LAB — HIV-1 RNA QUANT-NO REFLEX-BLD
HIV 1 RNA Quant: 54 copies/mL — ABNORMAL HIGH
HIV-1 RNA Quant, Log: 1.73 Log copies/mL — ABNORMAL HIGH

## 2018-07-10 ENCOUNTER — Encounter: Payer: Self-pay | Admitting: Infectious Diseases

## 2018-07-11 ENCOUNTER — Telehealth: Payer: Self-pay

## 2018-07-14 ENCOUNTER — Telehealth: Payer: Self-pay | Admitting: Pharmacy Technician

## 2018-07-14 ENCOUNTER — Telehealth: Payer: Self-pay | Admitting: Pharmacist

## 2018-07-14 ENCOUNTER — Encounter: Payer: Self-pay | Admitting: Infectious Diseases

## 2018-07-14 ENCOUNTER — Ambulatory Visit (INDEPENDENT_AMBULATORY_CARE_PROVIDER_SITE_OTHER): Payer: Self-pay | Admitting: Infectious Diseases

## 2018-07-14 VITALS — BP 166/100 | HR 78 | Temp 98.1°F | Wt 142.0 lb

## 2018-07-14 DIAGNOSIS — Z79899 Other long term (current) drug therapy: Secondary | ICD-10-CM

## 2018-07-14 DIAGNOSIS — B20 Human immunodeficiency virus [HIV] disease: Secondary | ICD-10-CM

## 2018-07-14 DIAGNOSIS — I10 Essential (primary) hypertension: Secondary | ICD-10-CM

## 2018-07-14 DIAGNOSIS — Z23 Encounter for immunization: Secondary | ICD-10-CM

## 2018-07-14 DIAGNOSIS — Z113 Encounter for screening for infections with a predominantly sexual mode of transmission: Secondary | ICD-10-CM

## 2018-07-14 DIAGNOSIS — E785 Hyperlipidemia, unspecified: Secondary | ICD-10-CM

## 2018-07-14 MED ORDER — ABACAVIR-DOLUTEGRAVIR-LAMIVUD 600-50-300 MG PO TABS: 1.0000 | ORAL_TABLET | Freq: Every day | ORAL | 2 refills | Status: DC

## 2018-07-14 NOTE — Telephone Encounter (Signed)
Patient is having issues with getting his HIV medication. He makes roughly $68,000 per year. As such, he is over income for ADAP. He is also over income for any patient assistance.  I called Matthew Tyler and he is not eligible for their patient assistance program as he is over income.  I called patient access network (PAN) and they do not offer support for uninsured, just copay assistance for insured patients.  I called patient advocate foundation (PAF) and they also only offer copay assistance for insured patients.  I called [employer] and was able to get a 3 month approval for Triumeq. But they stated that he is way over income and must get insurance after that.   Patient has been told since February of last year, 2019, that he must get insurance. He was told in February 2019, May 2019, and again when I saw him last November.  I also gave him information to set up a meeting with a counselor for open enrollment and I made it quite clear that he has exhausted all options.  Unfortunately, he decided not to get insurance during open enrollment.  I hate that we cannot help him anymore, but he has literally exhausted all options for assistance/free medication and MUST get insurance.  [facility] will fill his 3 months of Triumeq.

## 2018-07-14 NOTE — Addendum Note (Signed)
Addended by: Gerarda Fraction on: 07/14/2018 09:58 AM   Modules accepted: Orders

## 2018-07-14 NOTE — Assessment & Plan Note (Signed)
He is asx Will recheck prior to leaving clinic.

## 2018-07-14 NOTE — Assessment & Plan Note (Addendum)
Will refer him for colon, he is worried about cost Spoke with pharm to get ART- he has exhausted all his options for med assist. He has met with [place], [place], [place] previously. They rec he get into insurance marketplace.  Offered/refused condoms.  Flu and PCV today.  He will rtc in 8 months.

## 2018-07-14 NOTE — Telephone Encounter (Signed)
Patient assistance input at [facility] [employer] ID [id number] B  [id number] P  PDMI [id number]  He was told that this is for 3 months only with the understanding that he must get private insurance for future medications.  He stated he understands.

## 2018-07-14 NOTE — Progress Notes (Signed)
   Subjective:    Patient ID: Matthew Tyler, male    DOB: 11-08-1970, 48 y.o.   MRN: [id number]  HPI 48 yo M with HIV+. 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 He was then changed to triumeq 06-2014 (genotype PR: A71T).Changed to stribild then biktarvy at last visit (01-2017).  At his last visit he was changed back to atripla (given 4 months samples) due to out of pocket costs.  He has more dose of his ART before he runs out.  Has been very anxious about this. His pay has gone down 12k from last year.   HIV 1 RNA Quant (copies/mL)  Date Value  07/04/2018 54 (H)  09/12/2017 43 (H)  05/09/2017 182 (H)   CD4 T Cell Abs (/uL)  Date Value  07/04/2018 980  09/12/2017 980  05/09/2017 1,340    Review of Systems  Respiratory: Negative for shortness of breath.   Cardiovascular: Negative for chest pain.  Neurological: Negative for headaches.  has been watching diet. Taking D3, fish oil, MVI. Exercises regularly. Lives alone.  Eating at home more.  Please see HPI. All other systems reviewed and negative.      Objective:   Physical Exam Constitutional:      Appearance: Normal appearance.  HENT:     Mouth/Throat:     Mouth: Mucous membranes are moist.     Pharynx: No oropharyngeal exudate.  Eyes:     Extraocular Movements: Extraocular movements intact.     Pupils: Pupils are equal, round, and reactive to light.  Neck:     Musculoskeletal: Normal range of motion and neck supple.  Cardiovascular:     Rate and Rhythm: Normal rate and regular rhythm.  Pulmonary:     Effort: Pulmonary effort is normal.     Breath sounds: Normal breath sounds.  Abdominal:     General: Bowel sounds are normal. There is no distension.     Palpations: Abdomen is soft.     Tenderness: There is no abdominal tenderness.  Musculoskeletal:        General: No swelling.  Neurological:     General: No focal deficit present.     Mental Status: He is alert and oriented to person,  place, and time.       Assessment & Plan:

## 2018-07-21 ENCOUNTER — Other Ambulatory Visit: Payer: Self-pay

## 2018-07-21 ENCOUNTER — Telehealth: Payer: Self-pay | Admitting: Pharmacy Technician

## 2018-07-21 DIAGNOSIS — B20 Human immunodeficiency virus [HIV] disease: Secondary | ICD-10-CM

## 2018-07-21 MED ORDER — ABACAVIR-DOLUTEGRAVIR-LAMIVUD 600-50-300 MG PO TABS: 1.0000 | ORAL_TABLET | Freq: Every day | ORAL | 2 refills | Status: DC

## 2018-07-21 NOTE — Progress Notes (Signed)
Patient called office today stating he has not received his delivery for medication. Patient states that someone from our office told him on 1/24 that medication would be delivered. Patient has been off meds for one week now, and is not sure what to do next.  Called [facility] to check status on patient's medication. Spoke with [facility] who states that they need a new prescription since they cannot see it in their system. Prescription sent to pharmacy was confirmed by pharmacist. [facility] will run prescription before calling patient for delivery option.  Lorenso Courier, [place]

## 2018-07-21 NOTE — Telephone Encounter (Signed)
error 

## 2018-07-21 NOTE — Telephone Encounter (Signed)
Mr Matthew Tyler has been trying to get his medication filled at another pharmacy.  He had been given the assistance card information in the past.  He is relieved we can fill and ship to his home.  [facility] is mailing today 07/21/2018 to arrive Monday 07/24/2018.    The [employer] patient assistance for Triumeq is for 3 months through [employer].    After the 3 months he must have commercial insurance.  [employer] will not renew beyond this 3 months.  Also he is above the income level to be eligible for ADAP.  He stated to me that he understands.  He asked if there were any clinical trials that he could be a part of and that Dr. Ninetta LightsHatcher thought there may be.  I offered to transfer him to the triage nurses line so that they could ask the clinical staff.  He declined because he was getting ready to go under the house and said he will call next week.

## 2018-07-24 MED FILL — TRIUMEQ 600-50-300 MG TABS: 600-50-300 | 30 days supply | Qty: 30 | Fill #0

## 2018-07-25 NOTE — Telephone Encounter (Signed)
Thanks Betty 

## 2018-08-15 MED FILL — TRIUMEQ 600-50-300 MG TABS: 600-50-300 | 30 days supply | Qty: 30 | Fill #1

## 2018-09-11 MED FILL — TRIUMEQ 600-50-300 MG TABS: 600-50-300 | 30 days supply | Qty: 30 | Fill #2

## 2018-10-07 ENCOUNTER — Other Ambulatory Visit: Payer: Self-pay | Admitting: Infectious Diseases

## 2018-10-07 DIAGNOSIS — B20 Human immunodeficiency virus [HIV] disease: Secondary | ICD-10-CM

## 2018-10-11 ENCOUNTER — Telehealth: Payer: Self-pay | Admitting: Pharmacy Technician

## 2018-10-11 NOTE — Telephone Encounter (Addendum)
I left a HIPPA compliant voicemail for Mr. Usrey to call me back.  He has had 3 fills through patient assistance and has been told that will be all that we will be able to get for him.  That he is over income for ADAP and must get insurance.   Netty Starring. Dimas Aguas CPhT Specialty Pharmacy Patient [facility] for Infectious Disease Phone: [phone] Fax:  [phone]

## 2018-11-27 ENCOUNTER — Telehealth: Payer: Self-pay | Admitting: Pharmacy Technician

## 2018-11-27 DIAGNOSIS — B20 Human immunodeficiency virus [HIV] disease: Secondary | ICD-10-CM

## 2018-11-27 NOTE — Telephone Encounter (Signed)
Mr. Mirarchi called to let us know that [employer] contacted him stating he has been approved for 1 year of medication assistance for Triumeq.  To get the assistance, the doctor's office needs to send in an application.  I let him know that [place] told me he could get 3 months of medication, then he would need to get insurance during the 3 months.  I asked him to come into the clinic and sign the application, that we would try, which he did.  It has been faxed.  His income has dropped due to Covid to $48,000 a year.  We will continue to follow until there is a determination.  Venida Jarvis. Nadara Mustard [place] Patient [facility] for Infectious Disease Phone: [phone] Fax:  [phone]

## 2018-11-29 MED ORDER — ABACAVIR-DOLUTEGRAVIR-LAMIVUD 600-50-300 MG PO TABS: 1.0000 | ORAL_TABLET | Freq: Every day | ORAL | 11 refills | Status: DC

## 2018-11-29 NOTE — Addendum Note (Signed)
Addended by: Magda Kiel L on: 11/29/2018 12:02 PM   Modules accepted: Orders

## 2018-12-04 NOTE — Telephone Encounter (Signed)
[facility] Patient Advocate Encounter   Was successful in obtaining [employer] Patient Assistance for Triumeq due to his drop in income due to Covid19.  [employer] has the prescription and will mail the medication directly to his home.   Venida Jarvis. Nadara Mustard [place] Patient [facility] for Infectious Disease Phone: [phone] Fax:  [phone]

## 2018-12-11 NOTE — Telephone Encounter (Addendum)
Patient left a voicemail asking the status of his [employer] application. Attempted to call him back but went straight to voicemail. Patient called back around 09:28 and informed him that he is approved for patient assistance until 11/30/2019. The contact information for [employer] connect if patient has any additional questions is [phone]. Expected delivery is 10 days from 12/01/2018. Provided him the number and explained the process. He stated he has not taken the medication for 2 months.

## 2019-03-15 ENCOUNTER — Other Ambulatory Visit: Payer: Self-pay

## 2019-03-15 DIAGNOSIS — Z79899 Other long term (current) drug therapy: Secondary | ICD-10-CM

## 2019-03-15 DIAGNOSIS — B20 Human immunodeficiency virus [HIV] disease: Secondary | ICD-10-CM

## 2019-03-15 DIAGNOSIS — Z113 Encounter for screening for infections with a predominantly sexual mode of transmission: Secondary | ICD-10-CM

## 2019-03-15 DIAGNOSIS — E785 Hyperlipidemia, unspecified: Secondary | ICD-10-CM

## 2019-03-15 NOTE — Addendum Note (Signed)
Addended byMeriel Pica F on: 03/15/2019 09:01 AM   Modules accepted: Orders

## 2019-03-16 LAB — T-HELPER CELL (CD4) - (RCID CLINIC ONLY)
CD4 % Helper T Cell: 40 % (ref 33–65)
CD4 T Cell Abs: 974 /uL (ref 400–1790)

## 2019-03-21 LAB — HIV-1 RNA QUANT-NO REFLEX-BLD
HIV 1 RNA Quant: 202 copies/mL — ABNORMAL HIGH
HIV-1 RNA Quant, Log: 2.31 Log copies/mL — ABNORMAL HIGH

## 2019-03-21 LAB — CBC
HCT: 42.1 % (ref 38.5–50.0)
Hemoglobin: 14.6 g/dL (ref 13.2–17.1)
MCH: 34 pg — ABNORMAL HIGH (ref 27.0–33.0)
MCHC: 34.7 g/dL (ref 32.0–36.0)
MCV: 97.9 fL (ref 80.0–100.0)
MPV: 11.2 fL (ref 7.5–12.5)
Platelets: 233 10*3/uL (ref 140–400)
RBC: 4.3 10*6/uL (ref 4.20–5.80)
RDW: 14.4 % (ref 11.0–15.0)
WBC: 10 10*3/uL (ref 3.8–10.8)

## 2019-03-21 LAB — COMPREHENSIVE METABOLIC PANEL
AG Ratio: 1.6 (calc) (ref 1.0–2.5)
ALT: 13 U/L (ref 9–46)
AST: 31 U/L (ref 10–35)
Albumin: 4.4 g/dL (ref 3.6–5.1)
Alkaline phosphatase (APISO): 42 U/L (ref 35–144)
BUN: 10 mg/dL (ref 7–25)
CO2: 27 mmol/L (ref 20–32)
Calcium: 9.4 mg/dL (ref 8.6–10.3)
Chloride: 105 mmol/L (ref 98–110)
Creat: 1.03 mg/dL (ref 0.70–1.33)
Globulin: 2.8 g/dL (calc) (ref 1.9–3.7)
Glucose, Bld: 96 mg/dL (ref 65–99)
Potassium: 4.5 mmol/L (ref 3.5–5.3)
Sodium: 140 mmol/L (ref 135–146)
Total Bilirubin: 0.4 mg/dL (ref 0.2–1.2)
Total Protein: 7.2 g/dL (ref 6.1–8.1)

## 2019-03-21 LAB — LIPID PANEL
Cholesterol: 195 mg/dL (ref <200)
HDL: 58 mg/dL (ref >=40)
LDL Cholesterol (Calc): 102 mg/dL (calc) — ABNORMAL HIGH
Non-HDL Cholesterol (Calc): 137 mg/dL (calc) — ABNORMAL HIGH (ref <130)
Total CHOL/HDL Ratio: 3.4 (calc) (ref <5.0)
Triglycerides: 229 mg/dL — ABNORMAL HIGH (ref <150)

## 2019-03-21 LAB — RPR: RPR Ser Ql: NONREACTIVE

## 2019-03-30 ENCOUNTER — Encounter: Payer: Self-pay | Admitting: Infectious Diseases

## 2019-04-03 ENCOUNTER — Ambulatory Visit: Payer: Self-pay | Admitting: Infectious Diseases

## 2019-04-09 ENCOUNTER — Encounter: Payer: Self-pay | Admitting: Infectious Diseases

## 2019-04-09 ENCOUNTER — Other Ambulatory Visit: Payer: Self-pay

## 2019-04-09 ENCOUNTER — Ambulatory Visit (INDEPENDENT_AMBULATORY_CARE_PROVIDER_SITE_OTHER): Payer: Self-pay | Admitting: Infectious Diseases

## 2019-04-09 VITALS — BP 158/96 | HR 89 | Temp 98.3°F | Wt 139.0 lb

## 2019-04-09 DIAGNOSIS — Z113 Encounter for screening for infections with a predominantly sexual mode of transmission: Secondary | ICD-10-CM

## 2019-04-09 DIAGNOSIS — Z79899 Other long term (current) drug therapy: Secondary | ICD-10-CM

## 2019-04-09 DIAGNOSIS — L989 Disorder of the skin and subcutaneous tissue, unspecified: Secondary | ICD-10-CM

## 2019-04-09 DIAGNOSIS — Z23 Encounter for immunization: Secondary | ICD-10-CM

## 2019-04-09 DIAGNOSIS — B20 Human immunodeficiency virus [HIV] disease: Secondary | ICD-10-CM

## 2019-04-09 DIAGNOSIS — E785 Hyperlipidemia, unspecified: Secondary | ICD-10-CM

## 2019-04-09 DIAGNOSIS — I10 Essential (primary) hypertension: Secondary | ICD-10-CM

## 2019-04-09 DIAGNOSIS — F17200 Nicotine dependence, unspecified, uncomplicated: Secondary | ICD-10-CM

## 2019-04-09 NOTE — Assessment & Plan Note (Signed)
Would benefit from surveillance skin exam.  Will refer and see if he can get assistance.

## 2019-04-09 NOTE — Addendum Note (Signed)
Addended by: Aundria Rud on: 04/09/2019 10:20 AM   Modules accepted: Orders

## 2019-04-09 NOTE — Assessment & Plan Note (Signed)
He is doing well, still with blip.  We spoke about cabotegravir-rilpivirine.  Offered/refused condoms.  Did not get colon ($) Flu shot today. PCV23 if he will take.  Will see him back in 9 months.

## 2019-04-09 NOTE — Assessment & Plan Note (Signed)
Has quit 

## 2019-04-09 NOTE — Assessment & Plan Note (Signed)
Trig slightly up, on fish oil.  Chol good, explained to pt

## 2019-04-09 NOTE — Assessment & Plan Note (Signed)
Elevated today Continue his current rx, f/u with pcp Asx.

## 2019-04-09 NOTE — Progress Notes (Signed)
   Subjective:    Patient ID: Matthew Tyler, male    DOB: 03-16-1971, 48 y.o.   MRN: [id number]  HPI 48yo M with HIV+. 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 He was then changed to triumeq 06-2014 (genotype PR: A71T).Changed to stribild then biktarvy at last visit (01-2017). At his last visit he was changed back to triumeq (given 4 months samples) due to out of pocket costs.  Has been doing well. His salary is down 1/2 from last year. His ART has been making nauseated (triumeq). Has been taking with food to try and improve this.  Has otherwise been feeling well. Takes multiple supplements, avoid processed sugar.  Blames his BP on sausage biscuit he had for breakfast.  Denies missed art, was off for 3 months earlier this year.   HIV 1 RNA Quant (copies/mL)  Date Value  03/15/2019 202 (H)  07/04/2018 54 (H)  09/12/2017 43 (H)   CD4 T Cell Abs (/uL)  Date Value  03/15/2019 974  07/04/2018 980  09/12/2017 980    Review of Systems  Constitutional: Negative for appetite change and unexpected weight change.  Cardiovascular: Negative for chest pain.  Gastrointestinal: Positive for nausea. Negative for constipation and diarrhea.  Genitourinary: Negative for difficulty urinating.  Neurological: Negative for headaches.  Psychiatric/Behavioral: Negative for sleep disturbance.  Please see HPI. All other systems reviewed and negative.     Objective:   Physical Exam Constitutional:      Appearance: Normal appearance. He is not ill-appearing.  HENT:     Mouth/Throat:     Mouth: Mucous membranes are moist.     Pharynx: No oropharyngeal exudate.  Eyes:     Extraocular Movements: Extraocular movements intact.     Pupils: Pupils are equal, round, and reactive to light.  Neck:     Musculoskeletal: Normal range of motion and neck supple.  Cardiovascular:     Rate and Rhythm: Regular rhythm.  Pulmonary:     Effort: Pulmonary effort is normal.     Breath  sounds: Normal breath sounds.  Abdominal:     General: Bowel sounds are normal. There is no distension.     Palpations: Abdomen is soft.     Tenderness: There is no abdominal tenderness.  Musculoskeletal:     Right lower leg: No edema.     Left lower leg: No edema.  Skin:      Neurological:     General: No focal deficit present.     Mental Status: He is alert.  Psychiatric:        Mood and Affect: Mood normal.           Assessment & Plan:

## 2019-05-22 ENCOUNTER — Other Ambulatory Visit: Payer: Self-pay | Admitting: *Deleted

## 2019-05-22 DIAGNOSIS — Z20822 Contact with and (suspected) exposure to covid-19: Secondary | ICD-10-CM

## 2019-05-24 LAB — NOVEL CORONAVIRUS, NAA: SARS-CoV-2, NAA: NOT DETECTED

## 2019-07-18 ENCOUNTER — Telehealth: Payer: Self-pay

## 2019-07-18 NOTE — Telephone Encounter (Signed)
Patient called office today complaining of a tooth infection. States that he is experiencing tooth pain and face swelling. Would like to know if MD would be able to prescribe anything for him to help with this.  Informed patient that he must be seen in office before we can prescribe any medication. Offered appointment for tomorrow, but patient declined. Advised patient if he cannot scheduled an appointment with our office he could follow up with PCP or urgent care. Matthew Tyler, [place]

## 2019-07-18 NOTE — Telephone Encounter (Signed)
I could see him tomorrow at 2pm?

## 2019-12-11 ENCOUNTER — Other Ambulatory Visit: Payer: Self-pay | Admitting: Family

## 2019-12-11 ENCOUNTER — Telehealth: Payer: Self-pay | Admitting: Pharmacy Technician

## 2019-12-11 DIAGNOSIS — B20 Human immunodeficiency virus [HIV] disease: Secondary | ICD-10-CM

## 2019-12-11 MED ORDER — TRIUMEQ 600-50-300 MG PO TABS: 1.0000 | ORAL_TABLET | Freq: Every day | ORAL | 3 refills | Status: DC

## 2019-12-11 NOTE — Telephone Encounter (Addendum)
[facility] Patient Advocate Encounter   Mr. Doerr called stating he is out of medication and the pharmacy cannot refill.    I called [employer] to reenroll patient.  They stated they need application and 90 day prescription.  I have faxed both in to them.  He is approved and was provided pharmacy information for a 1x immediate fill.  We sent [facility]q [facility] [place] who states they have the medication in stock.  Netty Starring. Dimas Aguas CPhT Specialty Pharmacy Patient [facility] for Infectious Disease Phone: [phone] Fax:  [phone]

## 2019-12-12 ENCOUNTER — Other Ambulatory Visit: Payer: Self-pay | Admitting: Pharmacist

## 2019-12-12 DIAGNOSIS — B20 Human immunodeficiency virus [HIV] disease: Secondary | ICD-10-CM

## 2019-12-12 MED ORDER — TRIUMEQ 600-50-300 MG PO TABS: 1.0000 | ORAL_TABLET | Freq: Every day | ORAL | 0 refills | Status: DC

## 2019-12-13 ENCOUNTER — Other Ambulatory Visit: Payer: Self-pay | Admitting: Pharmacist

## 2019-12-13 DIAGNOSIS — B20 Human immunodeficiency virus [HIV] disease: Secondary | ICD-10-CM

## 2019-12-13 MED ORDER — TRIUMEQ 600-50-300 MG PO TABS: 1.0000 | ORAL_TABLET | Freq: Every day | ORAL | 0 refills | Status: DC

## 2019-12-26 ENCOUNTER — Other Ambulatory Visit: Payer: Self-pay

## 2020-01-10 ENCOUNTER — Encounter: Payer: Self-pay | Admitting: Infectious Diseases

## 2020-02-06 ENCOUNTER — Other Ambulatory Visit: Payer: Self-pay

## 2020-02-06 ENCOUNTER — Ambulatory Visit: Payer: Self-pay

## 2020-02-06 DIAGNOSIS — E785 Hyperlipidemia, unspecified: Secondary | ICD-10-CM

## 2020-02-06 DIAGNOSIS — Z113 Encounter for screening for infections with a predominantly sexual mode of transmission: Secondary | ICD-10-CM

## 2020-02-06 DIAGNOSIS — B20 Human immunodeficiency virus [HIV] disease: Secondary | ICD-10-CM

## 2020-02-06 DIAGNOSIS — Z79899 Other long term (current) drug therapy: Secondary | ICD-10-CM

## 2020-02-07 LAB — T-HELPER CELL (CD4) - (RCID CLINIC ONLY)
CD4 % Helper T Cell: 37 % (ref 33–65)
CD4 T Cell Abs: 790 /uL (ref 400–1790)

## 2020-02-09 LAB — HIV-1 RNA QUANT-NO REFLEX-BLD
HIV 1 RNA Quant: 120 Copies/mL — ABNORMAL HIGH
HIV-1 RNA Quant, Log: 2.08 Log cps/mL — ABNORMAL HIGH

## 2020-02-26 ENCOUNTER — Encounter: Payer: Self-pay | Admitting: Infectious Diseases

## 2020-02-26 ENCOUNTER — Telehealth: Payer: Self-pay | Admitting: Pharmacy Technician

## 2020-02-26 ENCOUNTER — Other Ambulatory Visit: Payer: Self-pay | Admitting: Infectious Diseases

## 2020-02-26 ENCOUNTER — Ambulatory Visit (INDEPENDENT_AMBULATORY_CARE_PROVIDER_SITE_OTHER): Payer: Self-pay | Admitting: Infectious Diseases

## 2020-02-26 ENCOUNTER — Other Ambulatory Visit: Payer: Self-pay | Admitting: Pharmacist

## 2020-02-26 ENCOUNTER — Other Ambulatory Visit: Payer: Self-pay

## 2020-02-26 VITALS — BP 165/113 | HR 92 | Temp 98.6°F | Ht 70.0 in | Wt 140.0 lb

## 2020-02-26 DIAGNOSIS — B20 Human immunodeficiency virus [HIV] disease: Secondary | ICD-10-CM

## 2020-02-26 DIAGNOSIS — B182 Chronic viral hepatitis C: Secondary | ICD-10-CM

## 2020-02-26 DIAGNOSIS — E785 Hyperlipidemia, unspecified: Secondary | ICD-10-CM

## 2020-02-26 MED ORDER — DOVATO 50-300 MG PO TABS: 1.0000 | ORAL_TABLET | Freq: Every day | ORAL | 3 refills | Status: DC

## 2020-02-26 MED ORDER — DOVATO 50-300 MG PO TABS: 1.0000 | ORAL_TABLET | Freq: Every day | ORAL | 11 refills | Status: DC

## 2020-02-26 NOTE — Telephone Encounter (Signed)
[facility] Patient Advocate Encounter   Patient is approved for Dovato assistance.  When ready for refills, he will need to call them and they will mail to his home.  The number is [phone].   Netty Starring. Dimas Aguas CPhT Specialty Pharmacy Patient [facility] for Infectious Disease Phone: [phone] Fax:  [phone]

## 2020-02-26 NOTE — Addendum Note (Signed)
Addended by: Robinette Haines on: 02/26/2020 03:49 PM   Modules accepted: Orders

## 2020-02-26 NOTE — Assessment & Plan Note (Signed)
We spoke about changing his meds.  Thinks triumeq is giving him arthritis.  Will switch him to dovato.  He does not want COVID vaccine.  Will see him back in 2 months.

## 2020-02-26 NOTE — Progress Notes (Signed)
   Subjective:    Patient ID: Matthew Tyler, male    DOB: 28-Apr-1971, 49 y.o.   MRN: [id number]  HPI 49yo M with HIV+. 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 He was then changed to triumeq 06-2014 (genotype PR: A71T).Changed to stribild then biktarvy at last visit (01-2017). At his last visit he was changed back to triumeq (given 4 months samples) due to out of pocket costs.   Has been having pain in his R elbow, L knee like they are bruised.  Does not believe this is related to his supplements.   Has not gotten COVID- prefers his own immune system to work out.   HIV 1 RNA Quant  Date Value  02/06/2020 120 Copies/mL (H)  03/15/2019 202 copies/mL (H)  07/04/2018 54 copies/mL (H)   CD4 T Cell Abs (/uL)  Date Value  02/06/2020 790  03/15/2019 974  07/04/2018 980    Review of Systems  Constitutional: Negative for appetite change, chills, fever and unexpected weight change.  Respiratory: Negative for cough and shortness of breath.   Gastrointestinal: Negative for constipation and diarrhea.  Genitourinary: Negative for difficulty urinating.  Psychiatric/Behavioral: Negative for sleep disturbance.       Objective:   Physical Exam Vitals reviewed.  Constitutional:      Appearance: Normal appearance.  HENT:     Head: Normocephalic.     Mouth/Throat:     Mouth: Mucous membranes are moist.     Pharynx: No oropharyngeal exudate.  Eyes:     Extraocular Movements: Extraocular movements intact.     Pupils: Pupils are equal, round, and reactive to light.  Cardiovascular:     Rate and Rhythm: Normal rate.  Pulmonary:     Effort: Pulmonary effort is normal.     Breath sounds: Normal breath sounds.  Abdominal:     General: Bowel sounds are normal. There is no distension.     Palpations: Abdomen is soft.     Tenderness: There is no abdominal tenderness.  Musculoskeletal:        General: Normal range of motion.     Cervical back: Normal range of  motion and neck supple.     Right lower leg: No edema.     Left lower leg: No edema.  Neurological:     General: No focal deficit present.     Mental Status: He is alert.  Psychiatric:        Mood and Affect: Mood normal.           Assessment & Plan:

## 2020-02-26 NOTE — Assessment & Plan Note (Signed)
Lab Results  Component Value Date   CHOL 195 03/15/2019   HDL 58 03/15/2019   LDLCALC 102 (H) 03/15/2019   TRIG 229 (H) 03/15/2019   CHOLHDL 3.4 03/15/2019

## 2020-03-03 ENCOUNTER — Ambulatory Visit: Payer: Self-pay

## 2020-04-02 ENCOUNTER — Telehealth: Payer: Self-pay | Admitting: *Deleted

## 2020-04-02 ENCOUNTER — Ambulatory Visit
Admission: RE | Admit: 2020-04-02 | Discharge: 2020-04-02 | Disposition: A | Discharge status: Home / Self Care | Payer: No Typology Code available for payment source | Source: Ambulatory Visit | Attending: Infectious Diseases | Admitting: Infectious Diseases

## 2020-04-02 DIAGNOSIS — B182 Chronic viral hepatitis C: Secondary | ICD-10-CM

## 2020-04-02 NOTE — Telephone Encounter (Signed)
Patient called just after leaving the ultrasound appointment, would like to know 1) why this test was ordered and 2) what the results are. Will forward to provider. Andree Coss, RN

## 2020-04-04 NOTE — Telephone Encounter (Signed)
Relayed to patient. He will review any further questions with Dr Ninetta Lights at upcoming appointment 11/9.

## 2020-04-04 NOTE — Telephone Encounter (Signed)
Thank you :)

## 2020-04-04 NOTE — Telephone Encounter (Signed)
He had a previous test positive for hep C antibodies. His viral load was negative. The ultrasounds was ordered to follow up his lifer structure, function.  thanks

## 2020-04-29 ENCOUNTER — Encounter: Payer: Self-pay | Admitting: Infectious Diseases

## 2020-04-29 ENCOUNTER — Other Ambulatory Visit: Payer: Self-pay

## 2020-04-29 ENCOUNTER — Ambulatory Visit (INDEPENDENT_AMBULATORY_CARE_PROVIDER_SITE_OTHER): Payer: Self-pay | Admitting: Infectious Diseases

## 2020-04-29 VITALS — BP 163/106 | HR 112 | Wt 144.0 lb

## 2020-04-29 DIAGNOSIS — R599 Enlarged lymph nodes, unspecified: Secondary | ICD-10-CM | POA: Insufficient documentation

## 2020-04-29 DIAGNOSIS — B20 Human immunodeficiency virus [HIV] disease: Secondary | ICD-10-CM

## 2020-04-29 DIAGNOSIS — F17200 Nicotine dependence, unspecified, uncomplicated: Secondary | ICD-10-CM

## 2020-04-29 DIAGNOSIS — Z79899 Other long term (current) drug therapy: Secondary | ICD-10-CM

## 2020-04-29 DIAGNOSIS — Z113 Encounter for screening for infections with a predominantly sexual mode of transmission: Secondary | ICD-10-CM

## 2020-04-29 DIAGNOSIS — I10 Essential (primary) hypertension: Secondary | ICD-10-CM

## 2020-04-29 NOTE — Assessment & Plan Note (Addendum)
Will have him seen by general surgery. Especially in light of his hx of smoking.

## 2020-04-29 NOTE — Assessment & Plan Note (Addendum)
He appears to be doing well He wants to defer getting labs today He defers flu vax as well as covid vax.  Will get him in with financial counselor.  Offered/refused condoms.   rtc in 6 months.

## 2020-04-29 NOTE — Assessment & Plan Note (Signed)
Attributes to work stress.  Will follow at next visit.

## 2020-04-29 NOTE — Assessment & Plan Note (Signed)
Encouraged to quit.  Give number for quit line, patches.

## 2020-04-29 NOTE — Progress Notes (Signed)
   Subjective:    Patient ID: Matthew Tyler, male    DOB: Jun 18, 1971, 49 y.o.   MRN: [id number]  HPI 49yo M with HIV+ 2014. 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 He was then changed to triumeq 06-2014 (genotype PR: [id number]).Changed to stribild then biktarvy at last visit (01-2017). 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   He has noted a swollen LN in his L mandibular angle.  Wants it gone. States it was prev there and "blew out all this infection in my hand".  Has not gotten covid vax (consents to interview).   He had f/u elastogram on 03-2020 that was normal.    HIV 1 RNA Quant  Date Value  02/06/2020 120 Copies/mL (H)  03/15/2019 202 copies/mL (H)  07/04/2018 54 copies/mL (H)   CD4 T Cell Abs (/uL)  Date Value  02/06/2020 790  03/15/2019 974  07/04/2018 980    Review of Systems  Constitutional: Negative for appetite change and unexpected weight change.  HENT: Positive for dental problem (infected tooth, L maxilla).   Respiratory: Negative for cough and shortness of breath.   Cardiovascular: Negative for chest pain.  Gastrointestinal: Negative for constipation and diarrhea.  Genitourinary: Negative for difficulty urinating.  Neurological: Negative for headaches.  Hematological: Positive for adenopathy.       Objective:   Physical Exam Vitals reviewed.  Constitutional:      Appearance: Normal appearance.  HENT:     Mouth/Throat:     Mouth: Mucous membranes are moist.     Pharynx: No oropharyngeal exudate.  Eyes:     Extraocular Movements: Extraocular movements intact.     Pupils: Pupils are equal, round, and reactive to light.  Cardiovascular:     Rate and Rhythm: Normal rate and regular rhythm.  Pulmonary:     Effort: Pulmonary effort is normal.     Breath sounds: Normal breath sounds.  Abdominal:      General: Bowel sounds are normal. There is no distension.     Palpations: Abdomen is soft.     Tenderness: There is no abdominal tenderness.  Musculoskeletal:        General: Normal range of motion.     Cervical back: Normal range of motion and neck supple.     Right lower leg: No edema.     Left lower leg: No edema.  Lymphadenopathy:     Head:     Left side of head: Submandibular adenopathy present.     Cervical: Cervical adenopathy present.  Skin:    General: Skin is warm and dry.  Neurological:     General: No focal deficit present.     Mental Status: He is alert.  Psychiatric:        Mood and Affect: Mood normal.           Assessment & Plan:

## 2020-05-01 ENCOUNTER — Telehealth: Payer: Self-pay

## 2020-05-01 NOTE — Telephone Encounter (Addendum)
[facility] Patient Advocate Encounter   [facility] Patient Advocate Encounter   Was successful in obtaining a [employer] copay card for Dovato.  This copay card will make the patients copay 0.00.  I have spoken with the patient.    [facility] will mail the medication to patient every month.[phone])  RxBin: [id number] PCN: [id number] Member ID: [id number] Group ID: [id number]  Clearance Coots, CPhT Specialty Pharmacy Patient [facility] for [facility] Phone: [phone] Fax:  [phone]

## 2020-05-01 NOTE — Telephone Encounter (Signed)
[facility] Patient Advocate Encounter   I have reached out to patient to get some  information on his income and HHS, for a [facility] application to help pay for his Dovato.     Clearance Coots, CPhT Specialty Pharmacy Patient [facility] for Infectious Disease Phone: [phone] Fax:  [phone]

## 2020-05-08 ENCOUNTER — Ambulatory Visit (INDEPENDENT_AMBULATORY_CARE_PROVIDER_SITE_OTHER): Payer: Self-pay | Admitting: General Surgery

## 2020-05-08 ENCOUNTER — Other Ambulatory Visit: Payer: Self-pay

## 2020-05-08 ENCOUNTER — Encounter: Payer: Self-pay | Admitting: General Surgery

## 2020-05-08 VITALS — BP 176/112 | HR 90 | Temp 98.5°F | Ht 70.0 in | Wt 146.0 lb

## 2020-05-08 DIAGNOSIS — L723 Sebaceous cyst: Secondary | ICD-10-CM

## 2020-05-08 NOTE — Patient Instructions (Addendum)
Dr Lady Gary will have patient to return for in-office procedure Tuesday November 23rd, 2021 at 10:30am. Patient advised to refrain scrubbing the area prior to the in-office procedure. Patient advised to NOT take any Aspirin prior to the procedure.  Epidermal Cyst Removal  Epidermal cyst removal is a procedure to remove a sac of oily material (keratin) that has formed under your skin (epidermal cyst). Epidermal cysts may also be called epidermoid cysts, sebaceous cysts, or keratin cysts. Normally, the skin secretes this oily material through a gland or a hair follicle. However, when a skin gland or hair follicle becomes blocked, an epidermal cyst can form. You may need this procedure if you have an epidermal cyst that becomes large, uncomfortable, or infected. Tell a health care provider about:  Any allergies you have.  All medicines you are taking, including vitamins, herbs, eye drops, creams, and over-the-counter medicines.  Any problems you or family members have had with anesthetic medicines.  Any blood disorders you have.  Any surgeries you have had.  Any medical conditions you have now or have had.  Whether you are pregnant or may be pregnant. What are the risks? Generally, this is a safe procedure. However, problems may occur, including:  Development of another cyst.  Bleeding.  Infection.  Scarring. What happens before the procedure?  Ask your health care provider about: ? Changing or stopping your regular medicines. This is especially important if you are taking diabetes medicines or blood thinners. ? Taking medicines such as aspirin and ibuprofen. These medicines can thin your blood. Do not take these medicines unless your health care provider tells you to take them. ? Taking over-the-counter medicines, vitamins, herbs, and supplements.  If you have an infected cyst, you may have to take antibiotic medicine before the cyst removal. Take your antibiotic as told by your  health care provider. Do not stop taking the antibiotic even if you start to feel better.  Take a shower on the morning of your procedure. Your health care provider may ask you to use a germ-killing soap. What happens during the procedure?   You will be given a medicine to numb the area (local anesthetic).  The skin around the cyst will be cleaned with a germ-killing solution.  The health care provider will make a small incision in your skin over the cyst.  The health care provider will separate the cyst from the surrounding tissues that are under your skin.  If possible, the cyst will be removed undamaged (intact).  If the cyst bursts (ruptures), it will be removed in pieces.  After the cyst is removed, the health care provider will control any bleeding and close the incision with small stitches (sutures). Small incisions may not need sutures, and the bleeding will be controlled by applying direct pressure with gauze.  The health care provider may apply antibiotic ointment and a bandage (dressing) over the incision. The procedure may vary among health care providers and hospitals. What happens after the procedure?  If your cyst ruptured during the procedure, you may need an antibiotic. If you are prescribed an antibiotic medicine or ointment, take or apply it as told by your health care provider. Do not stop using the antibiotic even if you start to feel better. Summary  Epidermal cyst removal is a procedure to remove a sac of oily material (keratin) that has formed under your skin (epidermal cyst).  You may need this procedure if you have an epidermal cyst that becomes large, uncomfortable, or infected.  The health care provider will make a small incision in your skin to remove the cyst.  If you are prescribed an antibiotic medicine before the procedure, after the procedure, or both, use the antibiotic as told by your health care provider. Do not stop using the antibiotic even if  you start to feel better. This information is not intended to replace advice given to you by your health care provider. Make sure you discuss any questions you have with your health care provider. Document Revised: 09/28/2018 Document Reviewed: 03/31/2017 Elsevier Patient Education  2020 [employer].

## 2020-05-08 NOTE — Progress Notes (Signed)
Patient ID: Matthew Tyler, male   DOB: Oct 19, 1970, 49 y.o.   MRN: [id number]  Chief Complaint  Patient presents with  . New Patient (Initial Visit)    new pt ref Dr Johny Sax enlarged lymph node    HPI Matthew Tyler is a 49 y.o. male.   He has been referred by Dr. Johny Sax for evaluation of a possible enlarged lymph node.  Mr. Matthew Tyler states that on 2 occasions over the past 20 years, he has noticed some swelling just anterior to his left ear.  He says that in around 2008 or 2009, it spontaneously ruptured into his hand.  He describes the discharge as white and ropey.  He does not recall an odor.  He says that it recently has cropped up again and he feels pressure in the site.  He denies any recent upper respiratory illnesses.  No fevers or chills.  No nausea or vomiting.  He has been applying topical hydrocortisone and Neosporin to the area, but states that these do not help.   Past Medical History:  Diagnosis Date  . Arthritis   . HIV disease (HCC)     History reviewed. No pertinent surgical history.  History reviewed. No pertinent family history.  Social History Social History   Tobacco Use  . Smoking status: Current Every Day Smoker    Packs/day: 1.00    Years: 32.00    Pack years: 32.00    Types: Cigarettes    Last attempt to quit: 09/19/2012    Years since quitting: 7.6  . Smokeless tobacco: Never Used  Substance Use Topics  . Alcohol use: Yes    Alcohol/week: 0.0 standard drinks    Comment: social  . Drug use: No    Allergies  Allergen Reactions  . Penicillins     Current Outpatient Medications  Medication Sig Dispense Refill  . Boron 3 MG CAPS Take 3 mg by mouth daily.    . Cholecalciferol (VITAMIN D3) 125 MCG (5000 UT) CAPS Take by mouth.    . Dolutegravir-lamiVUDine (DOVATO) 50-300 MG TABS Take 1 tablet by mouth daily. 30 tablet 11  . Lemon Oil OIL by Does not apply route.    . Multiple Vitamin (MULTIVITAMIN) capsule Take 1 capsule by mouth  daily.    . NON FORMULARY 1 drop Nightly. On Guard essential oil    . NONFORMULARY OR COMPOUNDED ITEM Take 1 capsule by mouth. On Guard Plus capsule    . Omega-3 Fatty Acids (OMEGA 3 PO) Take 1 capsule by mouth.    . peppermint oil liquid by Does not apply route as needed.     No current facility-administered medications for this visit.    Review of Systems Review of Systems  All other systems reviewed and are negative. Are as discussed in the history of present illness. Blood pressure (!) 176/112, pulse 90, temperature 98.5 F (36.9 C), temperature source Oral, height 5\' 10"  (1.778 m), weight 146 lb (66.2 kg), SpO2 98 %. Body mass index is 20.95 kg/m.  Physical Exam Physical Exam Vitals reviewed.  Constitutional:      General: He is not in acute distress.    Appearance: Normal appearance.  HENT:     Head: Normocephalic and atraumatic.      Comments: Just anterior to the left ear, there is a small, well-circumscribed soft tissue swelling.  Upon further inspection, there is a central pore with a small amount of white material at the opening.    Mouth/Throat:  Mouth: Mucous membranes are moist.     Pharynx: Oropharynx is clear.  Eyes:     General: No scleral icterus.       Right eye: No discharge.        Left eye: No discharge.     Conjunctiva/sclera: Conjunctivae normal.  Neck:     Comments: Mild shotty lymphadenopathy in the left jugular chain.  No thyromegaly or dominant thyroid mass appreciated. Cardiovascular:     Rate and Rhythm: Normal rate and regular rhythm.  Pulmonary:     Effort: Pulmonary effort is normal.     Breath sounds: Normal breath sounds.  Abdominal:     General: Bowel sounds are normal.     Palpations: Abdomen is soft.  Genitourinary:    Comments: Deferred Musculoskeletal:        General: No swelling or tenderness.  Skin:    General: Skin is warm and dry.  Neurological:     General: No focal deficit present.     Mental Status: He is alert  and oriented to person, place, and time.  Psychiatric:        Mood and Affect: Mood normal.        Behavior: Behavior normal.     Data Reviewed I reviewed the office visit with Dr. Ninetta Lights from April 29, 2020.  This is primarily addressed management of Mr. Fife' HIV medications.  It also describes an infected left maxillary tooth and comments on the area of concern today.  Assessment This is a 49 year old man with what appears to be most consistent with a sebaceous cyst just anterior to his left ear.  I think the lymphadenopathy appreciated on exam is more likely attributable to his dental problem rather than this lesion, as it does not appear to be infected.  I have offered him surgical removal of the cyst.  Plan We have scheduled him for a procedure visit next Tuesday.  I discussed the risks of the procedure with him today.  These include, but are not limited to, bleeding, infection, recurrence, pain, hematoma, seroma, and wound healing issues.  He had the opportunity to ask questions and these were answered.  I will see him next week for the procedure.    Duanne Guess 05/08/2020, 10:30 AM

## 2020-05-13 ENCOUNTER — Ambulatory Visit (INDEPENDENT_AMBULATORY_CARE_PROVIDER_SITE_OTHER): Payer: Self-pay | Admitting: General Surgery

## 2020-05-13 ENCOUNTER — Other Ambulatory Visit: Payer: Self-pay

## 2020-05-13 ENCOUNTER — Encounter: Payer: Self-pay | Admitting: General Surgery

## 2020-05-13 DIAGNOSIS — L723 Sebaceous cyst: Secondary | ICD-10-CM

## 2020-05-13 NOTE — Progress Notes (Signed)
Sebaceous Cyst Excision Procedure Note  Pre-operative Diagnosis: Epidermal cyst  Post-operative Diagnosis: same  Locations: Just anterior to the left earlobe   Indications: Intermittent periods of infection  Anesthesia: Lidocaine 1% with epinephrine without added sodium bicarbonate  Procedure Details  History of allergy to iodine: no  Patient informed of the risks (including bleeding and infection) and benefits of the  procedure and Written informed consent obtained.  The lesion and surrounding area was given a sterile prep using chlorhexidine and draped in the usual sterile fashion. An incision was made over the cyst, which was dissected free of the surrounding tissue and removed.  The cyst was filled with typical sebaceous material.  The wound was closed with 3-0 Vicryl using simple interrupted stitches, followed by running subcuticular Monocryl. Dermabond and Steri-Strips were applied.  The specimen was not sent for pathologic examination. The patient tolerated the procedure well.  EBL: Less than 2 ml  Findings: Typical sebaceous cyst with evidence of multiple episodes of inflammation/infection  Condition: Stable  Complications: none.  Plan: 1. Instructed to keep the wound dry and covered for 24-48h and clean thereafter. 2. Warning signs of infection were reviewed.   3. Recommended that the patient use OTC analgesics as needed for pain.  4. Return for wound check in 1 week. If no complications, the patient may cancel this visit.

## 2020-05-13 NOTE — Patient Instructions (Addendum)
We have removed a Cyst in our office today.  You have sutures under the skin that will dissolve and also dermabond (skin glue) and steri strips on top of your skin which will come off on their own in 10-14 days.  You may shower tomorrow, do not scrub at the area. Do not immerse the area until fully healed. Shave around the are for the first 10 days.  We will set you up for a post op appointment in 10 days.  Avoid Strenuous activities that will make you sweat during the next 48 hours to avoid the glue coming off prematurely. Avoid activities that will place pressure to this area of the body for 1-2 weeks to avoid re-injury to incision site. You may use Ibuprofen or Tylenol for pain or discomfort. Use ice to the area several times a day for  today and tomorrow if needed.  Please see your follow-up appointment provided. If you have any questions or concerns prior to this appointment, call our office and speak with a nurse.    Excision of Skin Cysts or Lesions Excision of a skin lesion refers to the removal of a section of skin by making small cuts (incisions) in the skin. This procedure may be done to remove a cancerous (malignant) or noncancerous (benign) growth on the skin. It is typically done to treat or prevent cancer or infection. It may also be done to improve cosmetic appearance. The procedure may be done to remove:  Cancerous growths, such as basal cell carcinoma, squamous cell carcinoma, or melanoma.  Noncancerous growths, such as a cyst or lipoma.  Growths, such as moles or skin tags, which may be removed for cosmetic reasons.  Various excision or surgical techniques may be used depending on your condition, the location of the lesion, and your overall health. Tell a health care provider about:  Any allergies you have.  All medicines you are taking, including vitamins, herbs, eye drops, creams, and over-the-counter medicines.  Any problems you or family members have had with  anesthetic medicines.  Any blood disorders you have.  Any surgeries you have had.  Any medical conditions you have.  Whether you are pregnant or may be pregnant. What are the risks? Generally, this is a safe procedure. However, problems may occur, including:  Bleeding.  Infection.  Scarring.  Recurrence of the cyst, lipoma, or cancer.  Changes in skin sensation or appearance, such as discoloration or swelling.  Reaction to the anesthetics.  Allergic reaction to surgical materials or ointments.  Damage to nerves, blood vessels, muscles, or other structures.  Continued pain.  What happens before the procedure?  Ask your health care provider about: ? Changing or stopping your regular medicines. This is especially important if you are taking diabetes medicines or blood thinners. ? Taking medicines such as aspirin and ibuprofen. These medicines can thin your blood. Do not take these medicines before your procedure if your health care provider instructs you not to.  You may be asked to take certain medicines.  You may be asked to stop smoking.  You may have an exam or testing.  Plan to have someone take you home after the procedure.  Plan to have someone help you with activities during recovery. What happens during the procedure?  To reduce your risk of infection: ? Your health care team will wash or sanitize their hands. ? Your skin will be washed with soap.  You will be given a medicine to numb the area (local anesthetic).  One of the following excision techniques will be performed.  At the end of any of these procedures, antibiotic ointment will be applied as needed. Each of the following techniques may vary among health care providers and hospitals. Complete Surgical Excision The area of skin that needs to be removed will be marked with a pen. Using a small scalpel or scissors, the surgeon will gently cut around and under the lesion until it is completely  removed. The lesion will be placed in a fluid and sent to the lab for examination. If necessary, bleeding will be controlled with a device that delivers heat (electrocautery). The edges of the wound may be stitched (sutured) together, and a bandage (dressing) will be applied. This procedure may be performed to treat a cancerous growth or a noncancerous cyst or lesion. Excision of a Cyst The surgeon will make an incision on the cyst. The entire cyst will be removed through the incision. The incision may be closed with sutures. Shave Excision During shave excision, the surgeon will use a small blade or an electrically heated loop instrument to shave off the lesion. This may be done to remove a mole or a skin tag. The wound will usually be left to heal on its own without sutures. Punch Excision During punch excision, the surgeon will use a small tool that is like a cookie cutter or a hole punch to cut a circle shape out of the skin. The outer edges of the skin will be sutured together. This may be done to remove a mole or a scar or to perform a biopsy of the lesion. Mohs Micrographic Surgery During Mohs micrographic surgery, layers of the lesion will be removed with a scalpel or a loop instrument and will be examined right away under a microscope. Layers will be removed until all of the abnormal or cancerous tissue has been removed. This procedure is minimally invasive, and it ensures the best cosmetic outcome. It involves the removal of as little normal tissue as possible. Mohs is usually done to treat skin cancer, such as basal cell carcinoma or squamous cell carcinoma, particularly on the face and ears. Depending on the size of the surgical wound, it may be sutured closed. What happens after the procedure?  Return to your normal activities as told by your health care provider.  Talk with your health care provider to discuss any test results, treatment options, and if necessary, the need for more  tests. This information is not intended to replace advice given to you by your health care provider. Make sure you discuss any questions you have with your health care provider. Document Released: 09/01/2009 Document Revised: 11/13/2015 Document Reviewed: 07/24/2014 Elsevier Interactive Patient Education  [employer].

## 2020-05-22 ENCOUNTER — Encounter: Payer: Self-pay | Admitting: General Surgery

## 2020-05-22 ENCOUNTER — Ambulatory Visit (INDEPENDENT_AMBULATORY_CARE_PROVIDER_SITE_OTHER): Payer: Self-pay | Admitting: General Surgery

## 2020-05-22 ENCOUNTER — Other Ambulatory Visit: Payer: Self-pay

## 2020-05-22 VITALS — BP 163/104 | HR 97 | Temp 98.8°F | Ht 70.0 in | Wt 146.0 lb

## 2020-05-22 DIAGNOSIS — L723 Sebaceous cyst: Secondary | ICD-10-CM

## 2020-05-22 NOTE — Patient Instructions (Signed)
Do not shave for another week or until scab falls off.

## 2020-05-22 NOTE — Progress Notes (Signed)
Mr. Lovvorn is here today for a wound check.  He underwent excision of a sebaceous cyst on the angle of his left mandible on November 23.  He denies any fevers or chills.  No nausea or vomiting.  He is concerned that the cyst is getting bigger.  There has been no drainage from the site.  Today's Vitals   05/22/20 1326  BP: (!) 163/104  Pulse: 97  Temp: 98.8 F (37.1 C)  TempSrc: Oral  SpO2: 98%  Weight: 146 lb (66.2 kg)  Height: 5\' 10"  (1.778 m)   Body mass index is 20.95 kg/m. Focused examination demonstrates the presence of Dermabond glue and an expected amount of post procedural swelling.  Impression and plan: I removed the residual Dermabond.  I reassured Mr. Eischeid that the swelling was normal due to having had the procedure and that it would resolve with time.  I would like him to wait another week before shaving again but he may resume all of his usual activities and hygiene regimen with that sole exception.  I will see him on an as-needed basis.

## 2020-10-24 ENCOUNTER — Other Ambulatory Visit (HOSPITAL_COMMUNITY): Payer: Self-pay

## 2020-10-24 ENCOUNTER — Ambulatory Visit: Payer: Self-pay

## 2020-10-24 ENCOUNTER — Other Ambulatory Visit: Payer: Self-pay

## 2020-10-24 DIAGNOSIS — B20 Human immunodeficiency virus [HIV] disease: Secondary | ICD-10-CM

## 2020-10-24 DIAGNOSIS — Z79899 Other long term (current) drug therapy: Secondary | ICD-10-CM

## 2020-10-24 DIAGNOSIS — Z113 Encounter for screening for infections with a predominantly sexual mode of transmission: Secondary | ICD-10-CM

## 2020-10-27 LAB — COMPREHENSIVE METABOLIC PANEL
AG Ratio: 1.5 (calc) (ref 1.0–2.5)
ALT: 15 U/L (ref 9–46)
AST: 26 U/L (ref 10–35)
Albumin: 4.5 g/dL (ref 3.6–5.1)
Alkaline phosphatase (APISO): 40 U/L (ref 35–144)
BUN: 13 mg/dL (ref 7–25)
CO2: 27 mmol/L (ref 20–32)
Calcium: 10.1 mg/dL (ref 8.6–10.3)
Chloride: 101 mmol/L (ref 98–110)
Creat: 1.09 mg/dL (ref 0.70–1.33)
Globulin: 3 g/dL (calc) (ref 1.9–3.7)
Glucose, Bld: 125 mg/dL — ABNORMAL HIGH (ref 65–99)
Potassium: 4.1 mmol/L (ref 3.5–5.3)
Sodium: 141 mmol/L (ref 135–146)
Total Bilirubin: 0.5 mg/dL (ref 0.2–1.2)
Total Protein: 7.5 g/dL (ref 6.1–8.1)

## 2020-10-27 LAB — CBC
HCT: 43.3 % (ref 38.5–50.0)
Hemoglobin: 15 g/dL (ref 13.2–17.1)
MCH: 35.1 pg — ABNORMAL HIGH (ref 27.0–33.0)
MCHC: 34.6 g/dL (ref 32.0–36.0)
MCV: 101.4 fL — ABNORMAL HIGH (ref 80.0–100.0)
MPV: 10.8 fL (ref 7.5–12.5)
Platelets: 239 10*3/uL (ref 140–400)
RBC: 4.27 10*6/uL (ref 4.20–5.80)
RDW: 13 % (ref 11.0–15.0)
WBC: 8.7 10*3/uL (ref 3.8–10.8)

## 2020-10-27 LAB — LIPID PANEL
Cholesterol: 193 mg/dL (ref <200)
HDL: 55 mg/dL (ref >=40)
LDL Cholesterol (Calc): 112 mg/dL (calc) — ABNORMAL HIGH
Non-HDL Cholesterol (Calc): 138 mg/dL (calc) — ABNORMAL HIGH (ref <130)
Total CHOL/HDL Ratio: 3.5 (calc) (ref <5.0)
Triglycerides: 147 mg/dL (ref <150)

## 2020-10-27 LAB — T-HELPER CELLS (CD4) COUNT (NOT AT ARMC)
Absolute CD4: 1118 cells/uL (ref 490–1740)
CD4 T Helper %: 46 % (ref 30–61)
Total lymphocyte count: 2435 cells/uL (ref 850–3900)

## 2020-10-27 LAB — RPR: RPR Ser Ql: NONREACTIVE

## 2020-10-27 LAB — HIV-1 RNA QUANT-NO REFLEX-BLD
HIV 1 RNA Quant: 70 Copies/mL — ABNORMAL HIGH
HIV-1 RNA Quant, Log: 1.85 Log cps/mL — ABNORMAL HIGH

## 2020-11-07 ENCOUNTER — Encounter: Payer: Self-pay | Admitting: Infectious Diseases

## 2020-11-14 ENCOUNTER — Encounter: Payer: Self-pay | Admitting: Infectious Diseases

## 2020-11-18 ENCOUNTER — Other Ambulatory Visit (HOSPITAL_COMMUNITY): Payer: Self-pay

## 2020-11-18 ENCOUNTER — Encounter: Payer: Self-pay | Admitting: Infectious Diseases

## 2020-11-18 ENCOUNTER — Ambulatory Visit (INDEPENDENT_AMBULATORY_CARE_PROVIDER_SITE_OTHER): Payer: Self-pay | Admitting: Infectious Diseases

## 2020-11-18 ENCOUNTER — Other Ambulatory Visit: Payer: Self-pay

## 2020-11-18 VITALS — BP 153/96 | HR 87 | Temp 98.7°F | Ht 70.0 in | Wt 143.0 lb

## 2020-11-18 DIAGNOSIS — Z113 Encounter for screening for infections with a predominantly sexual mode of transmission: Secondary | ICD-10-CM

## 2020-11-18 DIAGNOSIS — R599 Enlarged lymph nodes, unspecified: Secondary | ICD-10-CM

## 2020-11-18 DIAGNOSIS — L723 Sebaceous cyst: Secondary | ICD-10-CM

## 2020-11-18 DIAGNOSIS — B20 Human immunodeficiency virus [HIV] disease: Secondary | ICD-10-CM

## 2020-11-18 DIAGNOSIS — Z0000 Encounter for general adult medical examination without abnormal findings: Secondary | ICD-10-CM

## 2020-11-18 DIAGNOSIS — E785 Hyperlipidemia, unspecified: Secondary | ICD-10-CM

## 2020-11-18 DIAGNOSIS — F17200 Nicotine dependence, unspecified, uncomplicated: Secondary | ICD-10-CM

## 2020-11-18 DIAGNOSIS — I10 Essential (primary) hypertension: Secondary | ICD-10-CM

## 2020-11-18 DIAGNOSIS — Z79899 Other long term (current) drug therapy: Secondary | ICD-10-CM

## 2020-11-18 MED ORDER — NICOTINE 7 MG/24HR TD PT24: 7.0000 mg | MEDICATED_PATCH | Freq: Every day | TRANSDERMAL | 0 refills | Status: DC

## 2020-11-18 MED ORDER — NICOTINE 21 MG/24HR TD PT24: 21.0000 mg | MEDICATED_PATCH | Freq: Every day | TRANSDERMAL | 0 refills | Status: AC

## 2020-11-18 MED ORDER — NICOTINE 14 MG/24HR TD PT24: 14.0000 mg | MEDICATED_PATCH | Freq: Every day | TRANSDERMAL | 0 refills | Status: DC

## 2020-11-18 MED ORDER — DOVATO 50-300 MG PO TABS: 1.0000 | ORAL_TABLET | Freq: Every day | ORAL | 5 refills | Status: DC

## 2020-11-18 NOTE — Progress Notes (Signed)
   Subjective:    Patient ID: Matthew Tyler, male  DOB: [birth year], 50 y.o.        MRN: [id number]   HPI 50yo M with HIV+ 2014. 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 He was then changed to triumeq 06-2014 (genotype PR: A71T).Changed to stribild then biktarvy at last visit (01-2017). 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   He has noted a swollen LN in his L mandibular angle at his prior visit. He was seen at surgery and was noted that this was most likely a subaceous cyst. He had f/u visit, and had it removed over 1 hour.  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  Needs payroll information to get his ADAP, insurance renewed.   Is 30 days into a new core workout.  Has not had COVID vax.   He had f/u elastogram on 03-2020 that was normal.    HIV 1 RNA Quant  Date Value  10/24/2020 70 Copies/mL (H)  02/06/2020 120 Copies/mL (H)  03/15/2019 202 copies/mL (H)   CD4 T Cell Abs (/uL)  Date Value  02/06/2020 790  03/15/2019 974  07/04/2018 980     Health Maintenance  Topic Date Due  . COVID-19 Vaccine (1) Never done  . TETANUS/TDAP  Never done  . COLONOSCOPY (Pts 45-67yrs Insurance coverage will need to be confirmed)  Never done  . Zoster Vaccines- Shingrix (1 of 2) Never done  . INFLUENZA VACCINE  01/19/2021  . Hepatitis C Screening  Completed  . HIV Screening  Completed  . HPV VACCINES  Aged Out      Review of Systems  Constitutional: Negative for chills, fever and weight loss.  Respiratory: Negative for cough and shortness of breath.   Cardiovascular: Negative for chest pain.  Gastrointestinal: Negative for constipation and diarrhea.  Genitourinary: Negative for dysuria.  Neurological: Negative for headaches.    Please see HPI. All other systems reviewed and negative.     Objective:  Physical Exam Vitals  reviewed.  HENT:     Mouth/Throat:     Mouth: Mucous membranes are moist.     Pharynx: No oropharyngeal exudate.  Eyes:     Extraocular Movements: Extraocular movements intact.     Pupils: Pupils are equal, round, and reactive to light.  Cardiovascular:     Rate and Rhythm: Normal rate and regular rhythm.  Pulmonary:     Effort: Pulmonary effort is normal.     Breath sounds: Normal breath sounds.  Abdominal:     General: Bowel sounds are normal. There is no distension.     Palpations: Abdomen is soft.     Tenderness: There is no abdominal tenderness.  Musculoskeletal:        General: Normal range of motion.     Cervical back: Normal range of motion and neck supple.     Right lower leg: No edema.     Left lower leg: No edema.  Neurological:     General: No focal deficit present.     Mental Status: He is alert.  Psychiatric:        Mood and Affect: Mood normal.            Assessment & Plan:

## 2020-11-18 NOTE — Addendum Note (Signed)
Addended by: Dontel Harshberger C on: 11/18/2020 05:02 PM   Modules accepted: Orders

## 2020-11-18 NOTE — Assessment & Plan Note (Signed)
Lab Results  Component Value Date   CHOL 193 10/24/2020   HDL 55 10/24/2020   LDLCALC 112 (H) 10/24/2020   TRIG 147 10/24/2020   CHOLHDL 3.5 10/24/2020    Will continue to monitor

## 2020-11-18 NOTE — Assessment & Plan Note (Signed)
Appreciate general surgery eval.  Watch for recurrence.

## 2020-11-18 NOTE — Assessment & Plan Note (Signed)
He is doing well Will ask pharm if they have samples for next month while he gets insurance corrected Offered/refused condoms.  Will refer for colon rtc in 9 months.

## 2020-11-18 NOTE — Assessment & Plan Note (Addendum)
He defers pcp eval.  He is trying to control with diet and exercise.

## 2020-11-18 NOTE — Assessment & Plan Note (Signed)
It is very important that he quit smoking. There are various alternatives available to help with this difficult task, but first and foremost, he must make a firm commitment and decision to quit.  Will write rx for nicotine patches. Quit line.

## 2020-11-28 ENCOUNTER — Telehealth: Payer: Self-pay | Admitting: Infectious Diseases

## 2020-11-28 NOTE — Telephone Encounter (Signed)
Called pt and left VM.  (Symtuza) will not be covered by his insurance.  Offered other FDCs, asked that he call clinic or send my chart message for other options.  thanks

## 2021-03-02 ENCOUNTER — Other Ambulatory Visit: Payer: Self-pay | Admitting: Family

## 2021-03-02 DIAGNOSIS — B20 Human immunodeficiency virus [HIV] disease: Secondary | ICD-10-CM

## 2021-03-02 MED ORDER — DOVATO 50-300 MG PO TABS: 1.0000 | ORAL_TABLET | Freq: Every day | ORAL | 5 refills | Status: DC

## 2021-03-03 ENCOUNTER — Other Ambulatory Visit: Payer: Self-pay | Admitting: Pharmacist

## 2021-03-03 DIAGNOSIS — B20 Human immunodeficiency virus [HIV] disease: Secondary | ICD-10-CM

## 2021-03-03 MED ORDER — DOVATO 50-300 MG PO TABS: 1.0000 | ORAL_TABLET | Freq: Every day | ORAL | 0 refills | Status: AC

## 2021-03-03 NOTE — Progress Notes (Signed)
Medication Samples have been provided to the patient.  Drug name: Dovato        Strength: 50/300 mg         Qty: 28  Tablets (2 bottles) LOT: [id number]   Exp.Date: 12/23  Dosing instructions: Take one tablet by mouth once daily  The patient has been instructed regarding the correct time, dose, and frequency of taking this medication, including desired effects and most common side effects.   Abbey Veith, PharmD, CPP Clinical Pharmacist Practitioner Infectious Diseases Clinical Pharmacist [facility]   

## 2021-03-27 ENCOUNTER — Encounter: Payer: Self-pay | Admitting: General Surgery

## 2021-08-12 ENCOUNTER — Other Ambulatory Visit: Payer: Self-pay

## 2021-08-12 DIAGNOSIS — B20 Human immunodeficiency virus [HIV] disease: Secondary | ICD-10-CM

## 2021-08-12 DIAGNOSIS — Z113 Encounter for screening for infections with a predominantly sexual mode of transmission: Secondary | ICD-10-CM

## 2021-08-12 DIAGNOSIS — Z79899 Other long term (current) drug therapy: Secondary | ICD-10-CM

## 2021-08-12 NOTE — Addendum Note (Signed)
Addended by: Marcell Anger on: 08/12/2021 11:05 AM   Modules accepted: Orders

## 2021-08-12 NOTE — Addendum Note (Signed)
Addended by: Marcell Anger on: 08/12/2021 10:24 AM   Modules accepted: Orders

## 2021-08-14 LAB — CBC
HCT: 43.9 % (ref 38.5–50.0)
Hemoglobin: 15.1 g/dL (ref 13.2–17.1)
MCH: 34.7 pg — ABNORMAL HIGH (ref 27.0–33.0)
MCHC: 34.4 g/dL (ref 32.0–36.0)
MCV: 100.9 fL — ABNORMAL HIGH (ref 80.0–100.0)
MPV: 10.8 fL (ref 7.5–12.5)
Platelets: 264 10*3/uL (ref 140–400)
RBC: 4.35 10*6/uL (ref 4.20–5.80)
RDW: 12.7 % (ref 11.0–15.0)
WBC: 7.8 10*3/uL (ref 3.8–10.8)

## 2021-08-14 LAB — HIV-1 RNA QUANT-NO REFLEX-BLD
HIV 1 RNA Quant: 45 Copies/mL — ABNORMAL HIGH
HIV-1 RNA Quant, Log: 1.65 Log cps/mL — ABNORMAL HIGH

## 2021-08-14 LAB — COMPREHENSIVE METABOLIC PANEL
AG Ratio: 1.7 (calc) (ref 1.0–2.5)
ALT: 14 U/L (ref 9–46)
AST: 22 U/L (ref 10–35)
Albumin: 4.7 g/dL (ref 3.6–5.1)
Alkaline phosphatase (APISO): 44 U/L (ref 35–144)
BUN: 14 mg/dL (ref 7–25)
CO2: 31 mmol/L (ref 20–32)
Calcium: 9.6 mg/dL (ref 8.6–10.3)
Chloride: 102 mmol/L (ref 98–110)
Creat: 0.9 mg/dL (ref 0.70–1.30)
Globulin: 2.7 g/dL (calc) (ref 1.9–3.7)
Glucose, Bld: 106 mg/dL — ABNORMAL HIGH (ref 65–99)
Potassium: 4.1 mmol/L (ref 3.5–5.3)
Sodium: 139 mmol/L (ref 135–146)
Total Bilirubin: 0.5 mg/dL (ref 0.2–1.2)
Total Protein: 7.4 g/dL (ref 6.1–8.1)

## 2021-08-14 LAB — RPR: RPR Ser Ql: NONREACTIVE

## 2021-08-14 LAB — T-HELPER CELLS (CD4) COUNT (NOT AT ARMC)
Absolute CD4: 1118 cells/uL (ref 490–1740)
CD4 T Helper %: 44 % (ref 30–61)
Total lymphocyte count: 2526 cells/uL (ref 850–3900)

## 2021-08-14 LAB — LIPID PANEL
Cholesterol: 191 mg/dL (ref <200)
HDL: 53 mg/dL (ref >=40)
LDL Cholesterol (Calc): 108 mg/dL (calc) — ABNORMAL HIGH
Non-HDL Cholesterol (Calc): 138 mg/dL (calc) — ABNORMAL HIGH (ref <130)
Total CHOL/HDL Ratio: 3.6 (calc) (ref <5.0)
Triglycerides: 186 mg/dL — ABNORMAL HIGH (ref <150)

## 2021-08-28 ENCOUNTER — Ambulatory Visit (INDEPENDENT_AMBULATORY_CARE_PROVIDER_SITE_OTHER): Payer: Self-pay | Admitting: Infectious Diseases

## 2021-08-28 ENCOUNTER — Encounter: Payer: Self-pay | Admitting: Infectious Diseases

## 2021-08-28 ENCOUNTER — Other Ambulatory Visit: Payer: Self-pay

## 2021-08-28 VITALS — BP 152/90 | HR 97 | Resp 16 | Ht 70.0 in | Wt 146.0 lb

## 2021-08-28 DIAGNOSIS — R599 Enlarged lymph nodes, unspecified: Secondary | ICD-10-CM

## 2021-08-28 DIAGNOSIS — F17200 Nicotine dependence, unspecified, uncomplicated: Secondary | ICD-10-CM

## 2021-08-28 DIAGNOSIS — Z79899 Other long term (current) drug therapy: Secondary | ICD-10-CM

## 2021-08-28 DIAGNOSIS — Z113 Encounter for screening for infections with a predominantly sexual mode of transmission: Secondary | ICD-10-CM

## 2021-08-28 DIAGNOSIS — B20 Human immunodeficiency virus [HIV] disease: Secondary | ICD-10-CM

## 2021-08-28 DIAGNOSIS — I10 Essential (primary) hypertension: Secondary | ICD-10-CM

## 2021-08-28 MED ORDER — NICOTINE 7 MG/24HR TD PT24: 7.0000 mg | MEDICATED_PATCH | Freq: Every day | TRANSDERMAL | 0 refills | Status: DC

## 2021-08-28 MED ORDER — NICOTINE 14 MG/24HR TD PT24: 14.0000 mg | MEDICATED_PATCH | Freq: Every day | TRANSDERMAL | 0 refills | Status: DC

## 2021-08-28 NOTE — Progress Notes (Signed)
? ?  Subjective:  ? ? Patient ID: Matthew Tyler, male  DOB: 12/29/1970, 51 y.o.        MRN: [id number] ? ? ?HPI ?51 yo  M with HIV+ 2014. 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 He was then changed to triumeq 06-2014 (genotype PR: A71T). Changed to stribild then biktarvy at last visit (01-2017).  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  ?  ?He has noted a swollen LN in his L mandibular angle at his prior visit. He was seen at surgery and was noted that this was most likely a subaceous cyst. He had f/u visit, and had it removed. ?No recurrence. ?Has seen oral surgery, dental. Needs root canal (or extraction and implant) for infected tooth.  ? ?Has also been taking ivermectin as well. Feels better than ever, has been on for 53 months.  ? ? ?HIV 1 RNA Quant (Copies/mL)  ?Date Value  ?08/12/2021 45 (H)  ?10/24/2020 70 (H)  ?02/06/2020 120 (H)  ? ?CD4 T Cell Abs (/uL)  ?Date Value  ?02/06/2020 790  ?03/15/2019 974  ?07/04/2018 980  ? ? ? ?Health Maintenance  ?Topic Date Due  ?? COVID-19 Vaccine (1) Never done  ?? TETANUS/TDAP  Never done  ?? Zoster Vaccines- Shingrix (1 of 2) Never done  ?? COLONOSCOPY (Pts 45-51yrs Insurance coverage will need to be confirmed)  Never done  ?? INFLUENZA VACCINE  01/19/2021  ?? Hepatitis C Screening  Completed  ?? HIV Screening  Completed  ?? HPV VACCINES  Aged Out  ? ? ? ? ?Review of Systems  ?Constitutional:  Negative for chills, fever and weight loss.  ?Respiratory:  Negative for cough and shortness of breath.   ?Cardiovascular:  Negative for chest pain.  ?Gastrointestinal:  Negative for constipation and diarrhea.  ?Genitourinary:  Negative for dysuria.  ?Neurological:  Negative for headaches.  ? ?Please see HPI. All other systems reviewed and negative. ? ?   ?Objective:  ?Physical Exam ?Vitals reviewed.  ?Constitutional:   ?   Appearance:  Normal appearance.  ?HENT:  ?   Mouth/Throat:  ?   Mouth: Mucous membranes are moist.  ?   Pharynx: No oropharyngeal exudate.  ?Eyes:  ?   Extraocular Movements: Extraocular movements intact.  ?   Pupils: Pupils are equal, round, and reactive to light.  ?Cardiovascular:  ?   Rate and Rhythm: Normal rate and regular rhythm.  ?Pulmonary:  ?   Effort: Pulmonary effort is normal.  ?   Breath sounds: Normal breath sounds.  ?Abdominal:  ?   General: Bowel sounds are normal. There is no distension.  ?   Palpations: Abdomen is soft.  ?   Tenderness: There is no abdominal tenderness.  ?Musculoskeletal:     ?   General: Normal range of motion.  ?   Cervical back: Normal range of motion and neck supple.  ?   Right lower leg: No edema.  ?   Left lower leg: No edema.  ?Neurological:  ?   General: No focal deficit present.  ?   Mental Status: He is alert.  ?Psychiatric:     ?   Mood and Affect: Mood normal.  ? ? ? ? ? ?   ?Assessment & Plan:  ? ?

## 2021-08-28 NOTE — Assessment & Plan Note (Signed)
He is asx and is upset today with changes to his medical care.  ?I asked him to recheck when he picks up his rx. If still elevated, will see him in 1 month.  ?If not, will see him in 6 months.  ?

## 2021-08-28 NOTE — Assessment & Plan Note (Signed)
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  ?He does not want vaccines.  ?Offered/refused condoms.  ?Not sexually active but does feel like he has good support.  ?rtc as above.  ?

## 2021-08-28 NOTE — Assessment & Plan Note (Signed)
Encouraged to quit, his nicotine patches are refilled.  ?

## 2021-08-28 NOTE — Assessment & Plan Note (Signed)
resolved 

## 2021-11-22 ENCOUNTER — Encounter: Payer: Self-pay | Admitting: Infectious Diseases

## 2021-11-23 IMAGING — US US ABDOMEN COMPLETE W/ ELASTOGRAPHY
1 series · 11 of 11 positions shown · non-contrast
Comparison: None.

CLINICAL DATA: Hepatitis-C

EXAM:
ULTRASOUND ABDOMEN
ULTRASOUND HEPATIC ELASTOGRAPHY
TECHNIQUE: Sonography of the upper abdomen was performed. In addition,
ultrasound elastography evaluation of the liver was performed. A
region of interest was placed within the right lobe of the liver.
Following application of a compressive sonographic pulse, tissue
compressibility was assessed. Multiple assessments were performed at
the selected site. Median tissue compressibility was determined.
Previously, hepatic stiffness was assessed by shear wave velocity.
Based on recently published Society of Radiologists in Ultrasound
consensus article, reporting is now recommended to be performed in
the SI units of pressure (kiloPascals) representing hepatic
stiffness/elasticity. The obtained result is compared to the
published reference standards. (cACLD = compensated Advanced Chronic
Liver Disease)

[Series 1: us abdomen complete w/ elastography · 0.12mm/px · 11 of 11 slices shown]
[im 1/11]
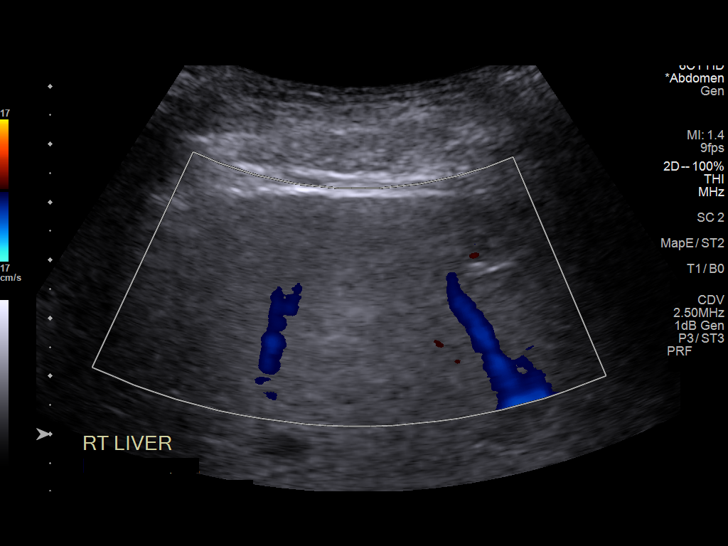
[im 2/11]
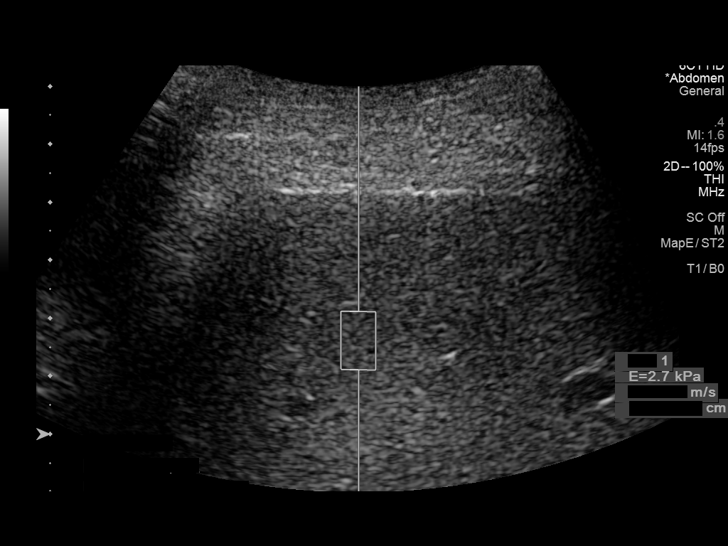
[im 3/11]
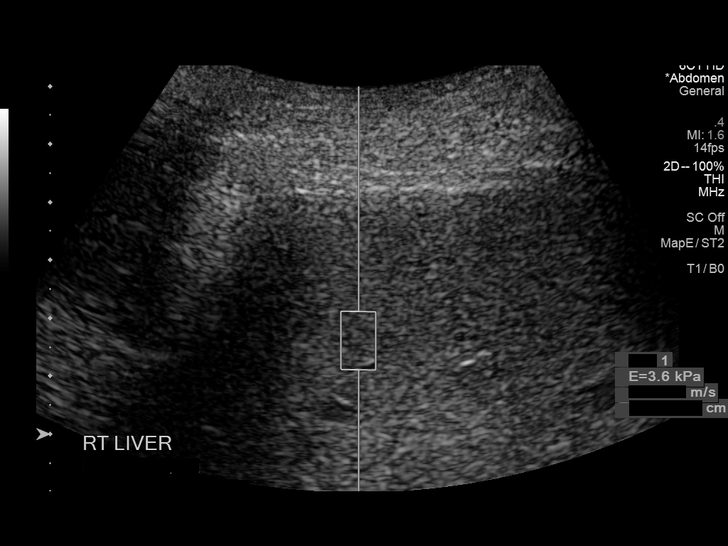
[im 4/11]
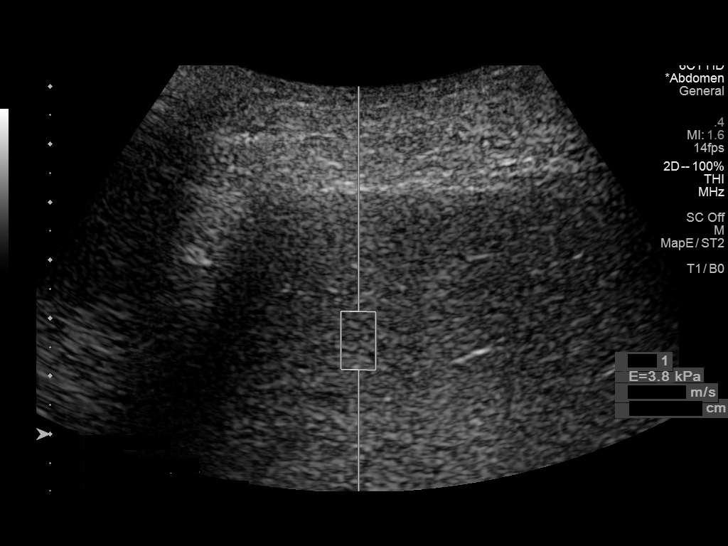
[im 5/11]
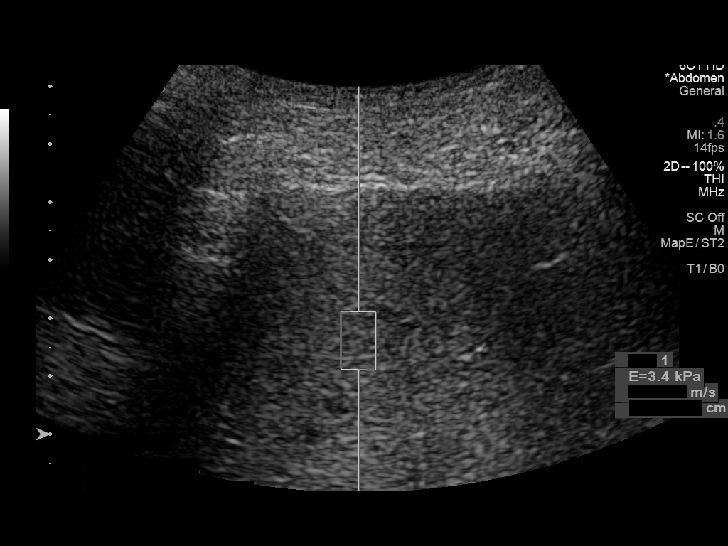
[im 6/11]
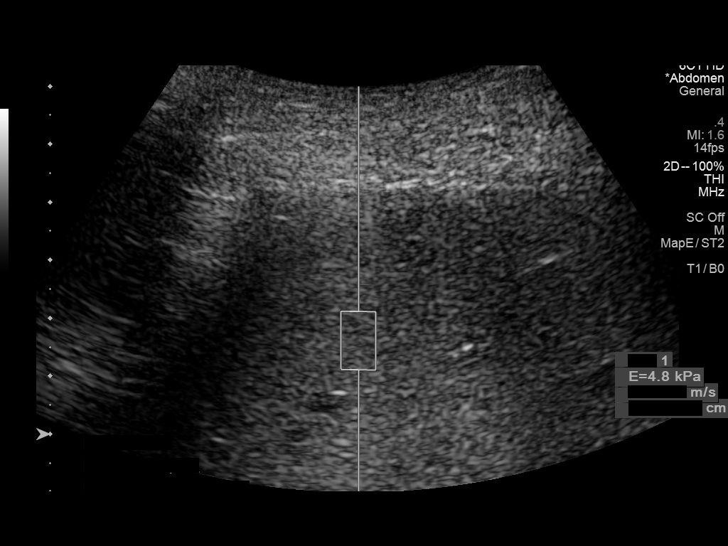
[im 7/11]
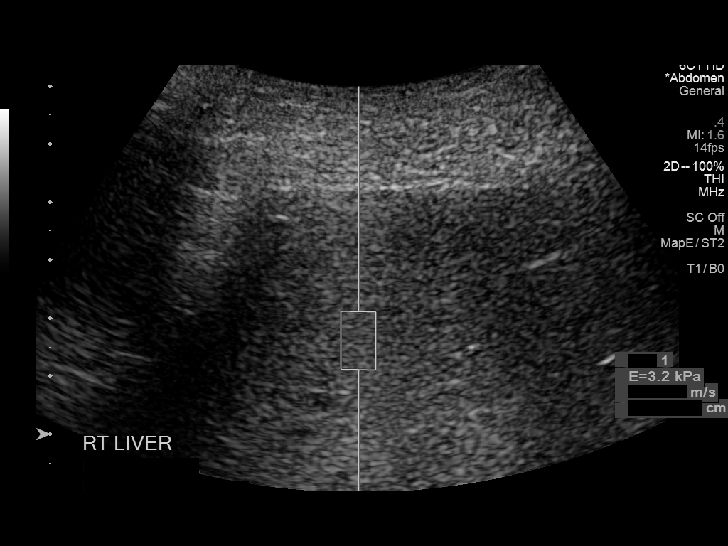
[im 8/11]
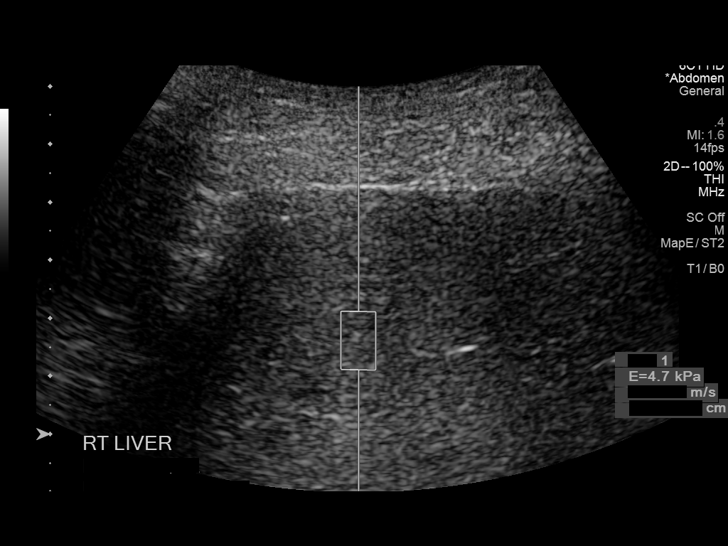
[im 9/11]
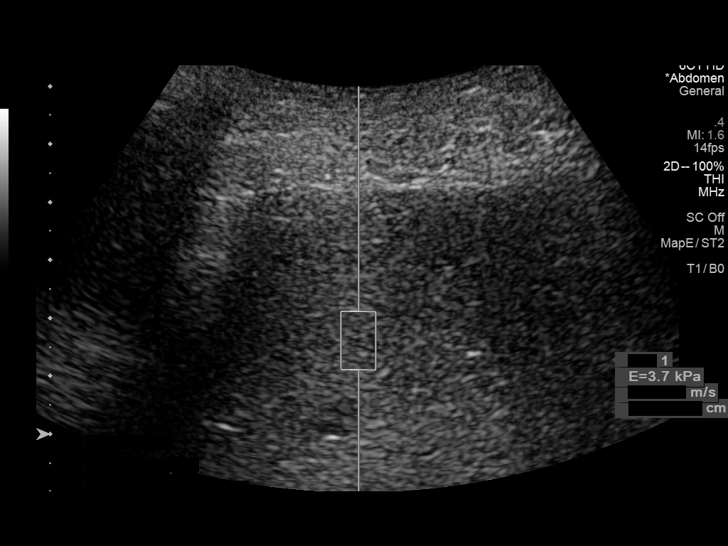
[im 10/11]
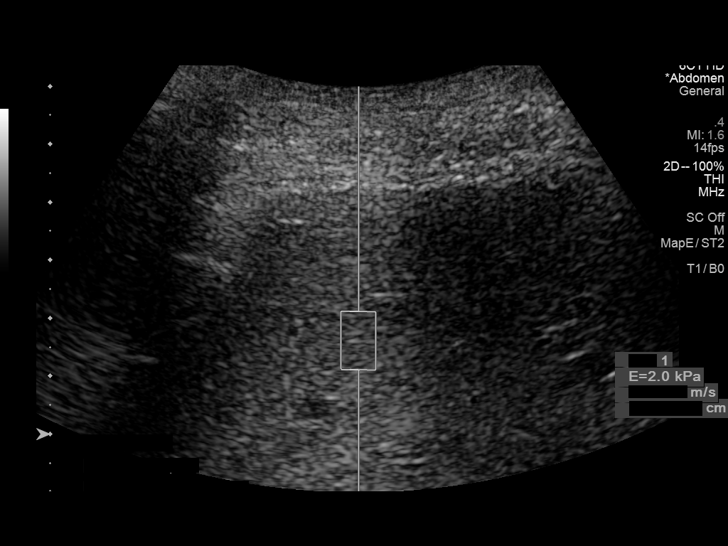
[im 11/11]
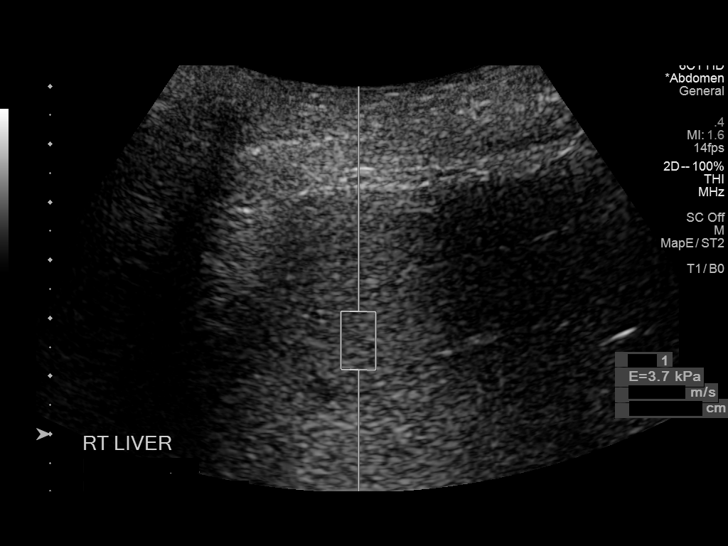

[11 of 11 positions shown; findings below may reference images not displayed]

FINDINGS: ULTRASOUND ABDOMEN

Gallbladder: 5 mm mobile gallstone. No wall thickening or
sonographic Murphy sign.

Common bile duct: Diameter: Normal caliber, 4 mm.

Liver: Heterogeneous echotexture. No mass. Portal vein is patent on
color Doppler imaging with normal direction of blood flow towards
the liver.

IVC: No abnormality visualized.

Pancreas: Visualized portion unremarkable.

Spleen: Size and appearance within normal limits.

Right Kidney: Length: 10.7 cm. Echogenicity within normal limits. No
mass or hydronephrosis visualized.

Left Kidney: Length: 11.1 cm. Echogenicity within normal limits. No
mass or hydronephrosis visualized.

Abdominal aorta: No aneurysm visualized.

Other findings: None.

ULTRASOUND HEPATIC ELASTOGRAPHY

Device: Siemens Helix VTQ

Patient position: Oblique

Transducer 6C1

Number of measurements: 10

Hepatic segment:  8

Median kPa:

IQR:

IQR/Median kPa ratio:

Data quality:  Good

Diagnostic category:  < or = 5 kPa: high probability of being normal

The use of hepatic elastography is applicable to patients with viral
hepatitis and non-alcoholic fatty liver disease. At this time, there
is insufficient data for the referenced cut-off values and use in
other causes of liver disease, including alcoholic liver disease.
Patients, however, may be assessed by elastography and serve as
their own reference standard/baseline.

In patients with non-alcoholic liver disease, the values suggesting
compensated advanced chronic liver disease (cACLD) may be lower, and
patients may need additional testing with elasticity results of [DATE]
kPa.

Please note that abnormal hepatic elasticity and shear wave
velocities may also be identified in clinical settings other than
with hepatic fibrosis, such as: acute hepatitis, elevated right
heart and central venous pressures including use of beta blockers,
Programmer disease (Egidans), infiltrative processes such as
mastocytosis/amyloidosis/infiltrative tumor/lymphoma, extrahepatic
cholestasis, with hyperemia in the post-prandial state, and with
liver transplantation. Correlation with patient history, laboratory
data, and clinical condition recommended.

Diagnostic Categories:

< or =5 kPa: high probability of being normal

< or =9 kPa: in the absence of other known clinical signs, rules [DATE] kPa and ?13 kPa: suggestive of cACLD, but needs further testing

>13 kPa: highly suggestive of cACLD

> or =17 kPa: highly suggestive of cACLD with an increased
probability of clinically significant portal hypertension
IMPRESSION: ULTRASOUND ABDOMEN:

Small mobile gallstone within the gallbladder. No evidence of acute
cholecystitis.

No focal hepatic abnormality.

ULTRASOUND HEPATIC ELASTOGRAPHY:

Median kPa:

Diagnostic category:  < or = 5 kPa: high probability of being normal

## 2022-01-05 ENCOUNTER — Other Ambulatory Visit: Payer: Self-pay | Admitting: *Deleted

## 2022-01-05 ENCOUNTER — Other Ambulatory Visit: Payer: Self-pay

## 2022-01-05 DIAGNOSIS — B20 Human immunodeficiency virus [HIV] disease: Secondary | ICD-10-CM

## 2022-01-05 MED ORDER — DOVATO 50-300 MG PO TABS: 1.0000 | ORAL_TABLET | Freq: Every day | ORAL | 5 refills | Status: DC

## 2022-01-05 NOTE — Telephone Encounter (Signed)
Patient called he is requesting a rx refill to be sent to [facility] for home delivery if the rx cannot be sent he wants the rx to be [facility] to [facility] [address] [place] Patient is requesting a call back on his work number [phone]

## 2022-01-05 NOTE — Telephone Encounter (Signed)
Pt calling as his prescription ran out.  Worried he made too much money for assistance.  Need [facility] card/assistance. He will call [facility] for more help as well.  I have refilled his rx to [facility] in the intervening period.

## 2022-01-06 ENCOUNTER — Other Ambulatory Visit (HOSPITAL_COMMUNITY): Payer: Self-pay

## 2022-01-06 ENCOUNTER — Telehealth: Payer: Self-pay

## 2022-01-06 NOTE — Telephone Encounter (Signed)
Reached out to pt regarding re-enrollment with [facility]. Discussed proof of income needed for application (w2 or recent paystubs), emailed info to patient as well. Will complete application on patients behalf. Pt agreed.

## 2022-02-03 ENCOUNTER — Telehealth: Payer: Self-pay

## 2022-02-03 NOTE — Telephone Encounter (Signed)
Submitted application for DOVATO to [employer] for patient assistance.   Phone: [phone]

## 2022-02-09 ENCOUNTER — Other Ambulatory Visit: Payer: Self-pay | Admitting: Infectious Diseases

## 2022-02-09 DIAGNOSIS — B20 Human immunodeficiency virus [HIV] disease: Secondary | ICD-10-CM

## 2022-02-09 MED ORDER — DOVATO 50-300 MG PO TABS: 1.0000 | ORAL_TABLET | Freq: Every day | ORAL | 5 refills | Status: DC

## 2022-02-12 NOTE — Telephone Encounter (Signed)
Received notification from [facility] regarding approval for DOVATO. Patient assistance approved from 02/11/22 to 02/12/23.  MEDICATION SHIPS TO PATIENTS HOME.  VERIFIED SHIPPING ADDR. MEDICATION WILL TAKE 24-48 TO PROCESS & 6-7 BUSINESS DAYS TO SHIP.  PT CAN CALL 2 WEEKS PRIOR TO REFILL OR CAN PROCESS ON WEBSITE [email or web address]  Phone: [phone]  Pt notified via email.

## 2022-08-24 ENCOUNTER — Other Ambulatory Visit: Payer: Self-pay | Admitting: Infectious Diseases

## 2022-08-24 DIAGNOSIS — B20 Human immunodeficiency virus [HIV] disease: Secondary | ICD-10-CM

## 2022-08-24 DIAGNOSIS — Z113 Encounter for screening for infections with a predominantly sexual mode of transmission: Secondary | ICD-10-CM

## 2022-08-24 DIAGNOSIS — Z79899 Other long term (current) drug therapy: Secondary | ICD-10-CM

## 2022-08-31 ENCOUNTER — Other Ambulatory Visit: Payer: Self-pay | Admitting: Infectious Diseases

## 2022-08-31 DIAGNOSIS — B20 Human immunodeficiency virus [HIV] disease: Secondary | ICD-10-CM

## 2022-08-31 DIAGNOSIS — Z113 Encounter for screening for infections with a predominantly sexual mode of transmission: Secondary | ICD-10-CM

## 2022-08-31 DIAGNOSIS — Z79899 Other long term (current) drug therapy: Secondary | ICD-10-CM

## 2022-11-29 ENCOUNTER — Other Ambulatory Visit: Payer: Self-pay | Admitting: Infectious Diseases

## 2022-11-29 DIAGNOSIS — B20 Human immunodeficiency virus [HIV] disease: Secondary | ICD-10-CM

## 2022-11-29 NOTE — Telephone Encounter (Signed)
Refill Request-Pt states his medication is normally mailed to him. Pt has sch his appt on 12/28/2022 and Lab work on 12/14/2022.  dolutegravir-lamiVUDine (DOVATO) 50-300 MG tablet    [id number] - [place], [place] - [address] AT [facility] [address] (Ph: [phone])

## 2022-11-30 MED ORDER — DOVATO 50-300 MG PO TABS: 1.0000 | ORAL_TABLET | Freq: Every day | ORAL | 5 refills | Status: DC

## 2022-11-30 NOTE — Addendum Note (Signed)
Addended by: Fredderick Severance on: 11/30/2022 09:52 AM   Modules accepted: Orders

## 2022-12-13 ENCOUNTER — Other Ambulatory Visit: Payer: Self-pay | Admitting: Internal Medicine

## 2022-12-13 DIAGNOSIS — Z113 Encounter for screening for infections with a predominantly sexual mode of transmission: Secondary | ICD-10-CM

## 2022-12-13 DIAGNOSIS — E785 Hyperlipidemia, unspecified: Secondary | ICD-10-CM

## 2022-12-13 DIAGNOSIS — B20 Human immunodeficiency virus [HIV] disease: Secondary | ICD-10-CM

## 2022-12-13 NOTE — Progress Notes (Signed)
Labs ordered.

## 2022-12-13 NOTE — Addendum Note (Signed)
Addended by: Bufford Spikes on: 12/13/2022 04:54 PM   Modules accepted: Orders

## 2022-12-14 ENCOUNTER — Other Ambulatory Visit (INDEPENDENT_AMBULATORY_CARE_PROVIDER_SITE_OTHER): Payer: Self-pay

## 2022-12-14 ENCOUNTER — Telehealth: Payer: Self-pay

## 2022-12-14 ENCOUNTER — Other Ambulatory Visit (HOSPITAL_COMMUNITY)
Admission: RE | Admit: 2022-12-14 | Discharge: 2022-12-14 | Disposition: A | Discharge status: Home / Self Care | Payer: Self-pay | Source: Ambulatory Visit | Attending: Student in an Organized Health Care Education/Training Program | Admitting: Student in an Organized Health Care Education/Training Program

## 2022-12-14 DIAGNOSIS — Z113 Encounter for screening for infections with a predominantly sexual mode of transmission: Secondary | ICD-10-CM | POA: Insufficient documentation

## 2022-12-14 DIAGNOSIS — B20 Human immunodeficiency virus [HIV] disease: Secondary | ICD-10-CM

## 2022-12-14 DIAGNOSIS — E785 Hyperlipidemia, unspecified: Secondary | ICD-10-CM

## 2022-12-14 NOTE — Progress Notes (Signed)
Patient presents for bloodwork. States he has not taken Dovato in ~ 1 month. He was unaware a 90 day supply with 5 refills was sent to [facility] on [address] on 11/30/22. States he usually receives this in the mail. Patient has 8 pharmacies listed. All but [facility] and [facility] in [place] have been deleted. He will call [facility] on [address]. If it is too expensive, he will call back and ask for it to be sent to [facility]. He thinks his PA for Dovato may need to be updated. Will route to [place] as well.  Confirmed patient's appt with Dr. Ninetta Lights on 12/28/22.

## 2022-12-14 NOTE — Telephone Encounter (Signed)
Left HIPAA appropriate message informing patient he is to call the patient assistance company Public house manager) to refill medication, at least 2 weeks prior to needing refills. This will ship to his home.  Provided with company phone number [phone]   He is enrolled thru 02/12/23

## 2022-12-15 LAB — LIPID PANEL
LDL Chol Calc (NIH): 120 mg/dL — ABNORMAL HIGH (ref 0–99)
Triglycerides: 132 mg/dL (ref 0–149)

## 2022-12-15 LAB — CBC
Hematocrit: 43 % (ref 37.5–51.0)
Platelets: 224 10*3/uL (ref 150–450)
RBC: 4.44 x10E6/uL (ref 4.14–5.80)

## 2022-12-15 LAB — T-HELPER CELL (CD4) - (RCID CLINIC ONLY)
CD4 % Helper T Cell: 43 % (ref 33–65)
CD4 T Cell Abs: 775 /uL (ref 400–1790)

## 2022-12-15 LAB — URINE CYTOLOGY ANCILLARY ONLY
Chlamydia: NEGATIVE
Comment: NEGATIVE
Comment: NORMAL
Neisseria Gonorrhea: NEGATIVE

## 2022-12-15 LAB — RPR

## 2022-12-15 LAB — CMP14 + ANION GAP
Albumin: 4.6 g/dL (ref 3.8–4.9)
BUN/Creatinine Ratio: 12 (ref 9–20)
Glucose: 91 mg/dL (ref 70–99)

## 2022-12-15 LAB — HIV-1 RNA QUANT-NO REFLEX-BLD

## 2022-12-15 NOTE — Telephone Encounter (Signed)
Pt reached out to [facility]. Medication out of refils.  Need a signed hardcopy rx to fax to company at [phone].  Dr. Ninetta Lights, could you drop this off in my box? Or fax to company if possible (if I'm not in office)?

## 2022-12-15 NOTE — Telephone Encounter (Signed)
Pt has been out of medication for about a month. Samples are available.  Pt wants to know will it be ok to start back medication (using samples)?   Being that I am in the office today, I will go ahead and prepare samples for pt. He is also aware I've attached his [facility] re-enrollment application with sample bag.  Medication Samples have been provided to the patient.  Drug name: DOVATO       Strength: 50MG -300MG         Qty: 14 TABS  LOT: [id number]  Exp.Date: 04/2023  Dosing instructions: TAKE 1 DAILY   Matthew Tyler 2:32 PM 12/15/2022

## 2022-12-16 LAB — CBC
Hemoglobin: 14.7 g/dL (ref 13.0–17.7)
MCH: 33.1 pg — ABNORMAL HIGH (ref 26.6–33.0)
MCHC: 34.2 g/dL (ref 31.5–35.7)
MCV: 97 fL (ref 79–97)
RDW: 13 % (ref 11.6–15.4)
WBC: 7.5 10*3/uL (ref 3.4–10.8)

## 2022-12-16 LAB — CMP14 + ANION GAP
ALT: 20 IU/L (ref 0–44)
AST: 32 IU/L (ref 0–40)
Alkaline Phosphatase: 61 IU/L (ref 44–121)
Anion Gap: 16 mmol/L (ref 10.0–18.0)
BUN: 13 mg/dL (ref 6–24)
Bilirubin Total: 0.3 mg/dL (ref 0.0–1.2)
CO2: 22 mmol/L (ref 20–29)
Calcium: 9.2 mg/dL (ref 8.7–10.2)
Chloride: 101 mmol/L (ref 96–106)
Creatinine, Ser: 1.12 mg/dL (ref 0.76–1.27)
Globulin, Total: 2.7 g/dL (ref 1.5–4.5)
Potassium: 4.4 mmol/L (ref 3.5–5.2)
Sodium: 139 mmol/L (ref 134–144)
Total Protein: 7.3 g/dL (ref 6.0–8.5)
eGFR: 78 mL/min/{1.73_m2} (ref >59)

## 2022-12-16 LAB — HIV-1 RNA QUANT-NO REFLEX-BLD: HIV-1 RNA Viral Load Log: 2.845 log10copy/mL

## 2022-12-16 LAB — LIPID PANEL
Chol/HDL Ratio: 3.4 ratio (ref 0.0–5.0)
Cholesterol, Total: 203 mg/dL — ABNORMAL HIGH (ref 100–199)
HDL: 60 mg/dL (ref >39)
VLDL Cholesterol Cal: 23 mg/dL (ref 5–40)

## 2022-12-16 NOTE — Telephone Encounter (Signed)
Pt picked up Dovato sample ( 1 bottle of 14 tabs) plus paperwork per [place].

## 2022-12-22 ENCOUNTER — Other Ambulatory Visit: Payer: Self-pay | Admitting: Infectious Diseases

## 2022-12-22 DIAGNOSIS — B20 Human immunodeficiency virus [HIV] disease: Secondary | ICD-10-CM

## 2022-12-22 MED ORDER — DOLUTEGRAVIR-LAMIVUDINE 50-300 MG PO TABS
1.0000 | ORAL_TABLET | Freq: Every day | ORAL | 3 refills | Status: AC
Start: 2022-12-22 — End: ?

## 2022-12-22 NOTE — Telephone Encounter (Signed)
Faxed refill RX to [facility] 12/22/22 - 90 day supply with 3 refills

## 2022-12-24 NOTE — Telephone Encounter (Signed)
[facility] called to confirm RX was received and to verify shipping address of patients home.   Medication should arrive in 7-10 business days

## 2022-12-28 ENCOUNTER — Ambulatory Visit (INDEPENDENT_AMBULATORY_CARE_PROVIDER_SITE_OTHER): Payer: Self-pay | Admitting: Infectious Diseases

## 2022-12-28 ENCOUNTER — Other Ambulatory Visit: Payer: Self-pay

## 2022-12-28 ENCOUNTER — Encounter: Payer: Self-pay | Admitting: Infectious Diseases

## 2022-12-28 VITALS — BP 153/86 | HR 71 | Temp 98.5°F | Ht 70.0 in | Wt 141.5 lb

## 2022-12-28 DIAGNOSIS — F17200 Nicotine dependence, unspecified, uncomplicated: Secondary | ICD-10-CM

## 2022-12-28 DIAGNOSIS — F17210 Nicotine dependence, cigarettes, uncomplicated: Secondary | ICD-10-CM

## 2022-12-28 DIAGNOSIS — Z23 Encounter for immunization: Secondary | ICD-10-CM

## 2022-12-28 DIAGNOSIS — E785 Hyperlipidemia, unspecified: Secondary | ICD-10-CM

## 2022-12-28 DIAGNOSIS — I10 Essential (primary) hypertension: Secondary | ICD-10-CM

## 2022-12-28 DIAGNOSIS — Z113 Encounter for screening for infections with a predominantly sexual mode of transmission: Secondary | ICD-10-CM

## 2022-12-28 DIAGNOSIS — B20 Human immunodeficiency virus [HIV] disease: Secondary | ICD-10-CM

## 2022-12-28 MED ORDER — NICOTINE 14 MG/24HR TD PT24
14.00 mg | MEDICATED_PATCH | Freq: Every day | TRANSDERMAL | 3 refills | Status: AC
Start: 2022-12-28 — End: ?

## 2022-12-28 MED ORDER — NICOTINE 7 MG/24HR TD PT24
7.0000 mg | MEDICATED_PATCH | Freq: Every day | TRANSDERMAL | 0 refills | Status: AC
Start: 2022-12-28 — End: ?

## 2022-12-28 MED ORDER — DOVATO 50-300 MG PO TABS
1.0000 | ORAL_TABLET | Freq: Every day | ORAL | 5 refills | Status: AC
Start: 2022-12-28 — End: ?

## 2022-12-28 NOTE — Assessment & Plan Note (Signed)
He is asx Will repeat

## 2022-12-28 NOTE — Assessment & Plan Note (Signed)
Has blip due to missing meds Med refilled Given samples Has used [facility]. Will reach out to [place].  Tdap today Offered/refused condoms.  COVID, Flu this fall.  Link him to dentist [facility].

## 2022-12-28 NOTE — Progress Notes (Signed)
Subjective:    Patient ID: Matthew Tyler, male  DOB: Nov 05, 1970, 52 y.o.        MRN: [id number]   HPI 52 yo  M with HIV+ 2014. 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 He was then changed to triumeq 06-2014 (genotype PR: A71T). Changed to stribild then biktarvy at last visit (01-2017).  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   Further dental surgery pending (implant).   He has lost wt and feels well.  He has changed his diet due to dumping syndrome. Has stopped eating out. Eating a lot steak.   Was off dovato for 1 month. Got 2 week supply at his lab f/u visit.   He had injury to L index finger from hedge trimmers, got 11 stitches. Still has some residual numbness. Not sure if he got Tdap with injury.  Resumed smoking after this.   Feels like he has urinary retention.   HIV 1 RNA Quant (Copies/mL)  Date Value  08/12/2021 45 (H)  10/24/2020 70 (H)  02/06/2020 120 (H)   HIV-1 RNA Viral Load (copies/mL)  Date Value  12/14/2022 700   CD4 T Cell Abs (/uL)  Date Value  12/14/2022 775  02/06/2020 790  03/15/2019 974   Lab Results  Component Value Date   CHOL 203 (H) 12/14/2022   HDL 60 12/14/2022   LDLCALC 120 (H) 12/14/2022   TRIG 132 12/14/2022   CHOLHDL 3.4 12/14/2022    The 78-GNFA ASCVD risk score (Arnett DK, et al., 2019) is: 11.4%   Values used to calculate the score:     Age: 52 years     Sex: Male     Is Non-Hispanic African American: No     Diabetic: No     Tobacco smoker: Yes     Systolic Blood Pressure: 153 mmHg     Is BP treated: No     HDL Cholesterol: 60 mg/dL     Total Cholesterol: 203 mg/dL   Health Maintenance  Topic Date Due  . COVID-19 Vaccine (1) Never done  . DTaP/Tdap/Td (1 - Tdap) Never done  . Zoster Vaccines- Shingrix (1 of 2) Never done  . Colonoscopy  Never done  . Lung Cancer  Screening  Never done  . INFLUENZA VACCINE  01/20/2023  . Hepatitis C Screening  Completed  . HIV Screening  Completed  . HPV VACCINES  Aged Out      Review of Systems  Constitutional:  Negative for chills, fever and weight loss.  Respiratory:  Negative for cough and shortness of breath.   Cardiovascular:  Negative for chest pain.  Gastrointestinal:  Negative for abdominal pain, constipation and diarrhea.  Genitourinary:  Negative for dysuria.  Neurological:  Negative for headaches.    Please see HPI. All other systems reviewed and negative.     Objective:  Physical Exam Vitals reviewed.  Constitutional:      Appearance: Normal appearance.  HENT:     Mouth/Throat:     Mouth: Mucous membranes are moist.     Pharynx: No oropharyngeal exudate.  Eyes:     Extraocular Movements: Extraocular movements intact.     Pupils: Pupils are equal, round, and reactive to light.  Cardiovascular:     Rate and Rhythm: Normal rate and regular rhythm.  Pulmonary:     Effort: Pulmonary effort  is normal.     Breath sounds: Normal breath sounds.  Abdominal:     General: Bowel sounds are normal. There is no distension.     Palpations: Abdomen is soft.     Tenderness: There is no abdominal tenderness.  Musculoskeletal:        General: Normal range of motion.     Cervical back: Normal range of motion and neck supple.     Right lower leg: No edema.     Left lower leg: No edema.  Neurological:     General: No focal deficit present.     Mental Status: He is alert.  Psychiatric:        Mood and Affect: Mood normal.          Assessment & Plan:

## 2022-12-28 NOTE — Addendum Note (Signed)
Addended by: Fredderick Severance on: 12/28/2022 03:08 PM   Modules accepted: Orders

## 2022-12-28 NOTE — Assessment & Plan Note (Signed)
We discussed statin  He wishes to defer Watch meat intake.

## 2022-12-28 NOTE — Assessment & Plan Note (Signed)
Will refil patches

## 2022-12-29 ENCOUNTER — Telehealth: Payer: Self-pay | Admitting: Infectious Diseases

## 2022-12-29 ENCOUNTER — Other Ambulatory Visit (HOSPITAL_COMMUNITY): Payer: Self-pay

## 2022-12-29 DIAGNOSIS — R3911 Hesitancy of micturition: Secondary | ICD-10-CM

## 2022-12-29 MED ORDER — TAMSULOSIN HCL 0.4 MG PO CAPS
0.4000 mg | ORAL_CAPSULE | Freq: Every day | ORAL | 3 refills | Status: AC
Start: 2022-12-29 — End: ?

## 2022-12-29 NOTE — Telephone Encounter (Signed)
Having some feeling of incomplete urination.  Will check psa, trial of flomax Explained his CD4 and HIV RNA to him.  He is still considering colonoscopy.

## 2022-12-29 NOTE — Telephone Encounter (Signed)
Submitted re-enrollment application for DOVATO to [facility] for patient assistance.   Phone: [phone]

## 2023-01-05 ENCOUNTER — Other Ambulatory Visit (HOSPITAL_COMMUNITY): Payer: Self-pay

## 2023-01-05 NOTE — Telephone Encounter (Signed)
Additional documentation needed: proof of no insurance, proof of income, Environmental consultant.  Pt bringing proof of income by office as soon as possible.

## 2023-01-18 NOTE — Telephone Encounter (Signed)
Faxed proof of income.  Will f/u with company to see if recent RX sent can be used towards renewal.

## 2023-01-20 NOTE — Telephone Encounter (Signed)
Received notification from [facility] regarding approval for DOVATO. Patient assistance approved from 02/12/23 to 02/10/24.  Medication ships to patients home. Patient will contact company up to 2 weeks early for refill shipment.  Phone: [phone]

## 2023-01-25 ENCOUNTER — Telehealth: Payer: Self-pay

## 2023-01-25 NOTE — Telephone Encounter (Signed)
[employer]  ...  She is requesting a call back ... She stated that you may have some of the pt pay stubs that they may need   TELE :[phone] Name : Matthew Tyler

## 2023-01-26 NOTE — Telephone Encounter (Signed)
Returned call.  Will email patients paystubs to [email or web address]

## 2023-08-30 ENCOUNTER — Other Ambulatory Visit: Payer: Self-pay | Admitting: Infectious Diseases

## 2023-08-30 ENCOUNTER — Encounter: Payer: Self-pay | Admitting: *Deleted

## 2023-08-30 DIAGNOSIS — B20 Human immunodeficiency virus [HIV] disease: Secondary | ICD-10-CM

## 2023-08-30 DIAGNOSIS — Z113 Encounter for screening for infections with a predominantly sexual mode of transmission: Secondary | ICD-10-CM

## 2023-08-30 DIAGNOSIS — Z79899 Other long term (current) drug therapy: Secondary | ICD-10-CM

## 2023-09-27 ENCOUNTER — Other Ambulatory Visit (INDEPENDENT_AMBULATORY_CARE_PROVIDER_SITE_OTHER): Payer: Self-pay

## 2023-09-27 DIAGNOSIS — Z113 Encounter for screening for infections with a predominantly sexual mode of transmission: Secondary | ICD-10-CM

## 2023-09-27 DIAGNOSIS — B20 Human immunodeficiency virus [HIV] disease: Secondary | ICD-10-CM

## 2023-09-27 DIAGNOSIS — Z79899 Other long term (current) drug therapy: Secondary | ICD-10-CM

## 2023-09-27 LAB — T-HELPER CELLS (CD4) COUNT (NOT AT ARMC)
CD4 % Helper T Cell: 44 % (ref 33–65)
CD4 T Cell Abs: 1042 /uL (ref 400–1790)

## 2023-09-28 LAB — COMPREHENSIVE METABOLIC PANEL WITH GFR
ALT: 14 IU/L (ref 0–44)
AST: 26 IU/L (ref 0–40)
Albumin: 4.3 g/dL (ref 3.8–4.9)
Alkaline Phosphatase: 55 IU/L (ref 44–121)
BUN/Creatinine Ratio: 12 (ref 9–20)
BUN: 13 mg/dL (ref 6–24)
Bilirubin Total: 0.2 mg/dL (ref 0.0–1.2)
CO2: 23 mmol/L (ref 20–29)
Calcium: 8.9 mg/dL (ref 8.7–10.2)
Chloride: 101 mmol/L (ref 96–106)
Creatinine, Ser: 1.05 mg/dL (ref 0.76–1.27)
Globulin, Total: 2.9 g/dL (ref 1.5–4.5)
Glucose: 128 mg/dL — ABNORMAL HIGH (ref 70–99)
Potassium: 4 mmol/L (ref 3.5–5.2)
Sodium: 140 mmol/L (ref 134–144)
Total Protein: 7.2 g/dL (ref 6.0–8.5)
eGFR: 84 mL/min/{1.73_m2} (ref >59)

## 2023-09-28 LAB — HIV-1 RNA QUANT-NO REFLEX-BLD
HIV-1 RNA Viral Load Log: 2.041 {Log_copies}/mL
HIV-1 RNA Viral Load: 110 {copies}/mL

## 2023-09-28 LAB — LIPID PANEL
Chol/HDL Ratio: 3.3 ratio (ref 0.0–5.0)
Cholesterol, Total: 180 mg/dL (ref 100–199)
HDL: 55 mg/dL (ref >39)
LDL Chol Calc (NIH): 98 mg/dL (ref 0–99)
Triglycerides: 153 mg/dL — ABNORMAL HIGH (ref 0–149)
VLDL Cholesterol Cal: 27 mg/dL (ref 5–40)

## 2023-09-28 LAB — CBC
Hematocrit: 43.3 % (ref 37.5–51.0)
Hemoglobin: 14.3 g/dL (ref 13.0–17.7)
MCH: 32.9 pg (ref 26.6–33.0)
MCHC: 33 g/dL (ref 31.5–35.7)
MCV: 100 fL — ABNORMAL HIGH (ref 79–97)
Platelets: 275 10*3/uL (ref 150–450)
RBC: 4.34 x10E6/uL (ref 4.14–5.80)
RDW: 13.1 % (ref 11.6–15.4)
WBC: 9.7 10*3/uL (ref 3.4–10.8)

## 2023-09-28 LAB — RPR: RPR Ser Ql: NONREACTIVE

## 2023-10-11 ENCOUNTER — Encounter: Payer: Self-pay | Admitting: Infectious Diseases

## 2023-10-12 ENCOUNTER — Encounter: Payer: Self-pay | Admitting: Infectious Diseases

## 2023-10-18 ENCOUNTER — Telehealth: Payer: Self-pay | Admitting: Infectious Diseases

## 2023-10-18 ENCOUNTER — Encounter: Payer: Self-pay | Admitting: Infectious Diseases

## 2023-10-18 NOTE — Telephone Encounter (Signed)
 Patient was here today for his appointment with Dr. Alwin Baars that was cancelled and moved to 11/02/2023.  He will not be able to come that day due to work conference.  Wants to know if Dr. Alwin Baars would be willing to call him another day, but next available is not until 12/06/2023.  Forwarding request to Dr. Alwin Baars and triage nurse.

## 2023-11-02 ENCOUNTER — Encounter: Payer: Self-pay | Admitting: Infectious Diseases

## 2023-12-20 ENCOUNTER — Other Ambulatory Visit (HOSPITAL_COMMUNITY): Payer: Self-pay

## 2023-12-20 ENCOUNTER — Telehealth: Payer: Self-pay

## 2023-12-20 NOTE — Telephone Encounter (Signed)
 Mailed Associate [employer] renewal application to patients home.

## 2024-02-22 ENCOUNTER — Other Ambulatory Visit (HOSPITAL_COMMUNITY): Payer: Self-pay

## 2024-02-22 NOTE — Telephone Encounter (Addendum)
 Application + income returned.   Dr. Eben could you print a signed hardcopy RX of patients Dovato  and place in my box? I will need to attach this with his re-enrollment application. Thanks!!

## 2024-02-29 NOTE — Progress Notes (Signed)
 Pharmacy Medication Assistance Program Note    02/29/2024  Patient ID: Matthew Tyler, male   DOB: 1970/12/31, 53 y.o.   MRN: [id number]     02/29/2024  Outreach Medication One  Manufacturer Medication One Other Manufacturer/Drug  Other Manufacturer/Drug VIIV CONNECT/ DOVATO   Type of Assistance Manufacturer Assistance  Date Application Submitted to Manufacturer 02/29/2024    Renewal submitted& received

## 2024-03-08 NOTE — Progress Notes (Signed)
 Pharmacy Medication Assistance Program Note    03/08/2024  Patient ID: Matthew Tyler, male   DOB: Apr 02, 1971, 53 y.o.   MRN: [id number]     02/29/2024  Outreach [employer]ication One  Manufacturer Medication One Other Manufacturer/Drug  Other Manufacturer/Drug VIIV CONNECT/ DOVATO   Type of Assistance Manufacturer Assistance  Date Application Submitted to Manufacturer 02/29/2024  Patient Assistance Determination Approved  Approval Start Date 03/07/2024  Approval End Date 03/07/2025       Shipment processed 03/07/24:
